# Patient Record
Sex: Female | Born: 1960 | Race: White | Hispanic: No | Marital: Married | Smoking: Former smoker
Health system: Southern US, Community
[De-identification: ages and names within clinical notes are randomized; demographics above are authoritative.]

## PROBLEM LIST (undated history)

## (undated) DIAGNOSIS — J449 Chronic obstructive pulmonary disease, unspecified: Secondary | ICD-10-CM

## (undated) DIAGNOSIS — I10 Essential (primary) hypertension: Secondary | ICD-10-CM

## (undated) DIAGNOSIS — K219 Gastro-esophageal reflux disease without esophagitis: Secondary | ICD-10-CM

## (undated) DIAGNOSIS — F419 Anxiety disorder, unspecified: Secondary | ICD-10-CM

## (undated) HISTORY — DX: Gastro-esophageal reflux disease without esophagitis: K21.9

## (undated) HISTORY — PX: ABLATION: SHX5711

## (undated) HISTORY — DX: Essential (primary) hypertension: I10

## (undated) HISTORY — DX: Anxiety disorder, unspecified: F41.9

## (undated) HISTORY — DX: Chronic obstructive pulmonary disease, unspecified: J44.9

---

## 1985-12-26 HISTORY — PX: TUBAL LIGATION: SHX77

## 1989-12-26 HISTORY — PX: RECONSTRUCTION OF NOSE: SHX2301

## 2001-11-27 ENCOUNTER — Other Ambulatory Visit: Admission: RE | Admit: 2001-11-27 | Discharge: 2001-11-27 | Payer: Self-pay | Admitting: Obstetrics and Gynecology

## 2004-04-05 ENCOUNTER — Encounter: Admission: RE | Admit: 2004-04-05 | Discharge: 2004-04-05 | Payer: Self-pay | Admitting: Family Medicine

## 2004-10-01 ENCOUNTER — Ambulatory Visit (HOSPITAL_COMMUNITY): Admission: RE | Admit: 2004-10-01 | Discharge: 2004-10-01 | Payer: Self-pay | Admitting: *Deleted

## 2011-01-16 ENCOUNTER — Encounter: Payer: Self-pay | Admitting: Family Medicine

## 2011-01-16 ENCOUNTER — Encounter: Payer: Self-pay | Admitting: *Deleted

## 2012-07-19 ENCOUNTER — Ambulatory Visit (INDEPENDENT_AMBULATORY_CARE_PROVIDER_SITE_OTHER): Payer: BC Managed Care – PPO | Admitting: Family Medicine

## 2012-07-19 VITALS — BP 120/88 | HR 63 | Temp 98.5°F | Resp 16 | Ht 66.0 in | Wt 166.0 lb

## 2012-07-19 DIAGNOSIS — Z23 Encounter for immunization: Secondary | ICD-10-CM

## 2012-07-19 DIAGNOSIS — S61409A Unspecified open wound of unspecified hand, initial encounter: Secondary | ICD-10-CM

## 2012-07-19 DIAGNOSIS — M25549 Pain in joints of unspecified hand: Secondary | ICD-10-CM

## 2012-07-19 MED ORDER — TETANUS-DIPHTH-ACELL PERTUSSIS 5-2.5-18.5 LF-MCG/0.5 IM SUSP
0.5000 mL | Freq: Once | INTRAMUSCULAR | Status: AC
  Administered 2012-07-19: 0.5 mL via INTRAMUSCULAR

## 2012-07-19 NOTE — Progress Notes (Signed)
Patient ID: SHERRELL WEIR MRN: [id number], DOB: 1961-09-01, 51 y.o. Date of Encounter: 07/19/2012, 6:25 PM   PROCEDURE NOTE: Wound of left distal index finger.   Verbal consent obtained. Sterile technique employed. Numbing: Anesthesia obtained with metacarpal block 2% lidocaine plain.  Cleansed with soap and water. Irrigated.  Wound explored, no deep structures involved, no foreign bodies.   Wound repaired with # 5 SI using 4-0 ethilon. Hemostasis obtained. Wound cleansed and dressed.  Wound care instructions including precautions covered with patient. Handout given.  Anticipate suture removal in 10 days.   Rhoderick Moody, PA-C 07/19/2012 6:25 PM

## 2012-07-19 NOTE — Patient Instructions (Addendum)

## 2012-07-19 NOTE — Progress Notes (Signed)
:   Subjective: Patient herself with scissors tried probably top off of something tetanus shot was many years ago.  Objective: Wound over the crease of the DFE of the left index finger, approximately 1 CM. Full range of motion.  Assessment: Wound hand  Plan: TDAP Herbert Seta will do the wound repair

## 2012-07-28 ENCOUNTER — Ambulatory Visit (INDEPENDENT_AMBULATORY_CARE_PROVIDER_SITE_OTHER): Payer: BC Managed Care – PPO | Admitting: Physician Assistant

## 2012-07-28 VITALS — BP 114/74 | HR 66 | Temp 98.1°F | Resp 18 | Ht 67.0 in | Wt 168.0 lb

## 2012-07-28 DIAGNOSIS — S61209A Unspecified open wound of unspecified finger without damage to nail, initial encounter: Secondary | ICD-10-CM

## 2012-07-28 NOTE — Progress Notes (Signed)
  Subjective:    Patient ID: Carrie Dillon, female    DOB: August 18, 1961, 51 y.o.   MRN: [id number]  HPI Pt presents to clinic for suture removal. The wound has healed it is still slightly tender, esp with use.   Review of Systems     Objective:   Physical Exam  Constitutional: She is oriented to person, place, and time. She appears well-developed and well-nourished.  HENT:  Head: Normocephalic and atraumatic.  Right Ear: External ear normal.  Left Ear: External ear normal.  Pulmonary/Chest: Effort normal.  Neurological: She is alert and oriented to person, place, and time.  Skin: Skin is warm and dry.       Wound well healed without any erythema or other signs of infection.  Sutures removed without difficulty.  Psychiatric: She has a normal mood and affect. Her behavior is normal. Judgment and thought content normal.       Assessment & Plan:  Wound finger - healed - wound care d/w pt.

## 2012-12-13 ENCOUNTER — Ambulatory Visit (INDEPENDENT_AMBULATORY_CARE_PROVIDER_SITE_OTHER): Payer: BC Managed Care – PPO | Admitting: Family Medicine

## 2012-12-13 VITALS — BP 180/94 | HR 79 | Temp 97.9°F | Resp 16 | Ht 66.5 in | Wt 174.0 lb

## 2012-12-13 DIAGNOSIS — I10 Essential (primary) hypertension: Secondary | ICD-10-CM | POA: Insufficient documentation

## 2012-12-13 DIAGNOSIS — K219 Gastro-esophageal reflux disease without esophagitis: Secondary | ICD-10-CM | POA: Insufficient documentation

## 2012-12-13 DIAGNOSIS — F411 Generalized anxiety disorder: Secondary | ICD-10-CM

## 2012-12-13 DIAGNOSIS — R079 Chest pain, unspecified: Secondary | ICD-10-CM

## 2012-12-13 DIAGNOSIS — F419 Anxiety disorder, unspecified: Secondary | ICD-10-CM | POA: Insufficient documentation

## 2012-12-13 LAB — POCT CBC
Granulocyte percent: 51.6 %G (ref 37–80)
Hemoglobin: 13.3 g/dL (ref 12.2–16.2)
Lymph, poc: 4.2 — AB (ref 0.6–3.4)
MCH, POC: 29.6 pg (ref 27–31.2)
MCHC: 31.1 g/dL — AB (ref 31.8–35.4)
MPV: 9.4 fL (ref 0–99.8)
POC MID %: 7.5 %M (ref 0–12)
RDW, POC: 12.6 %
WBC: 10.3 10*3/uL — AB (ref 4.6–10.2)

## 2012-12-13 MED ORDER — DIAZEPAM 5 MG PO TABS: 5.0000 mg | ORAL_TABLET | Freq: Every evening | ORAL | Status: DC | PRN

## 2012-12-13 MED ORDER — LISINOPRIL-HYDROCHLOROTHIAZIDE 10-12.5 MG PO TABS: 1.0000 | ORAL_TABLET | Freq: Every day | ORAL | Status: DC

## 2012-12-13 MED ORDER — ALPRAZOLAM 0.5 MG PO TABS: 0.2500 mg | ORAL_TABLET | Freq: Every evening | ORAL | Status: DC | PRN

## 2012-12-13 NOTE — Progress Notes (Signed)
Subjective:    Patient ID: Carrie Dillon, female    DOB: December 05, 1961, 51 y.o.   MRN: [id number]  HPI  This 51 y.o. female presents for evaluation of HTN. It is unclear if she has a history of HTN, but she routinely checked her BP until about a year ago. Started checking her BP at home again about 3 weeks ago.  Notes that it's been elevated, and has been rising.  138-180's/80's-90's.  The last few days, she's checking 4-5 times daily. Today, she got scared when she read on the internet that if her pressure was above 180, she needed to seek immediate medical attention.  She describes the sensation of difficulty swallowing and tightness in her throat, but thinks that's related to anxiety.  Her husband is currently out of work, and she's financially very worried-they can't afford their mortgage on her salary alone.  History of chest pain, "All the time."  Has been evaluated here, and referred to cardiology, who told her that her pain was GERD rather than cardiac. She has long-standing anxiety, but reports that she's felt worse with the medications she's been given previously.  She doesn't want anything she has to take every day. Has some HA.  No SOB, dizziness.  Past Medical History  Diagnosis Date  . Anxiety   . Hypertension   . GERD (gastroesophageal reflux disease)     Past Surgical History  Procedure Date  . Tubal ligation   . Cesarean section     Prior to Admission medications   Medication Sig Start Date End Date Taking? Authorizing Provider  omeprazole (PRILOSEC) 20 MG capsule Take 20 mg by mouth daily.   Yes Historical Provider, MD    Allergies  Allergen Reactions  . Codeine Nausea Only    History   Social History  . Marital Status: Married    Spouse Name: Gustavus "Emilee Hero"     Number of Children: 2  . Years of Education: 12   Occupational History  . sales    Social History Main Topics  . Smoking status: Former Games developer  . Smokeless tobacco: Never Used  . Alcohol  Use: 0.0 - 1.5 oz/week    0-3 drink(s) per week     Comment: once every 6 months  . Drug Use: Yes    Special: Marijuana     Comment: Tried x 3 to relax, but caused increased stress  . Sexually Active: Yes -- Female partner(s)    Birth Control/ Protection: Surgical   Other Topics Concern  . Not on file   Social History Narrative   Lives with her husband. Her adult children live in [place], [place].    Family History  Problem Relation Age of Onset  . Anxiety disorder Mother   . Colitis Mother   . Ovarian cancer Mother   . Cancer Mother   . Skin cancer Father   . Breast cancer Sister   . Cancer Sister   . Anxiety disorder Brother   . Anxiety disorder Daughter     Review of Systems As above.    Objective:   Physical Exam Blood pressure 180/94, pulse 79, temperature 97.9 F (36.6 C), resp. rate 16, height 5' 6.5" (1.689 m), weight 174 lb (78.926 kg), SpO2 99.00%. Body mass index is 27.66 kg/(m^2). Well-developed, well nourished WF who is awake, alert and oriented, in NAD, though she was initially crying. HEENT: [place]/AT, PERRL, EOMI.  Sclera and conjunctiva are clear.  EAC are patent, TMs are normal in appearance.  Nasal mucosa is pink and moist. OP is clear. Neck: supple, non-tender, no lymphadenopathy, thyromegaly. Heart: RRR, no murmur Lungs: normal effort, CTA Extremities: no cyanosis, clubbing or edema. Skin: warm and dry without rash. Psychologic: good mood and appropriate affect, normal speech and behavior.  EKG: NSR.  No evidence of ischemia/LVH.   Results for orders placed in visit on 12/13/12  POCT CBC      Component Value Range   WBC 10.3 (*) 4.6 - 10.2 K/uL   Lymph, poc 4.2 (*) 0.6 - 3.4   POC LYMPH PERCENT 40.9  10 - 50 %L   MID (cbc) 0.8  0 - 0.9   POC MID % 7.5  0 - 12 %M   POC Granulocyte 5.3  2 - 6.9   Granulocyte percent 51.6  37 - 80 %G   RBC 4.49  4.04 - 5.48 M/uL   Hemoglobin 13.3  12.2 - 16.2 g/dL   HCT, POC 16.1  09.6 - 47.9 %   MCV 95.2  80 -  97 fL   MCH, POC 29.6  27 - 31.2 pg   MCHC 31.1 (*) 31.8 - 35.4 g/dL   RDW, POC 04.5     Platelet Count, POC 338  142 - 424 K/uL   MPV 9.4  0 - 99.8 fL       Assessment & Plan:   1. HTN (hypertension)  POCT CBC, TSH, Comprehensive metabolic panel, lisinopril-hydrochlorothiazide (PRINZIDE,ZESTORETIC) 10-12.5 MG per tablet; I suspect that her anxiety is driving her pressure up.  2. GERD (gastroesophageal reflux disease)  Continue omeprazole; likely needs additional evaluation once her BP is controlled, given that she has persistent symptoms (chest pain)  3. Anxiety  diazepam (VALIUM) 5 MG tablet  4. Chest pain  EKG 12-Lead; likely reflux   Discussed with Dr. Clelia Croft.

## 2012-12-13 NOTE — Patient Instructions (Signed)
Stop checking your blood pressure at home.  Do at least one thing every day that makes you happy.

## 2012-12-14 ENCOUNTER — Encounter: Payer: Self-pay | Admitting: Physician Assistant

## 2012-12-14 LAB — COMPREHENSIVE METABOLIC PANEL
AST: 16 U/L (ref 0–37)
BUN: 11 mg/dL (ref 6–23)
CO2: 26 mEq/L (ref 19–32)
Calcium: 9.5 mg/dL (ref 8.4–10.5)
Chloride: 107 mEq/L (ref 96–112)
Creat: 0.82 mg/dL (ref 0.50–1.10)

## 2012-12-19 NOTE — Progress Notes (Signed)
Reviewed. Agree with assessment and plan 

## 2012-12-23 ENCOUNTER — Encounter: Payer: Self-pay | Admitting: Physician Assistant

## 2013-01-16 ENCOUNTER — Ambulatory Visit (INDEPENDENT_AMBULATORY_CARE_PROVIDER_SITE_OTHER): Payer: BC Managed Care – PPO | Admitting: Physician Assistant

## 2013-01-16 ENCOUNTER — Encounter: Payer: Self-pay | Admitting: Physician Assistant

## 2013-01-16 VITALS — BP 129/86 | HR 79 | Temp 98.2°F | Resp 16 | Ht 67.0 in | Wt 173.0 lb

## 2013-01-16 DIAGNOSIS — R238 Other skin changes: Secondary | ICD-10-CM

## 2013-01-16 DIAGNOSIS — R209 Unspecified disturbances of skin sensation: Secondary | ICD-10-CM

## 2013-01-16 DIAGNOSIS — R519 Headache, unspecified: Secondary | ICD-10-CM

## 2013-01-16 DIAGNOSIS — R51 Headache: Secondary | ICD-10-CM

## 2013-01-16 DIAGNOSIS — I10 Essential (primary) hypertension: Secondary | ICD-10-CM

## 2013-01-16 MED ORDER — LISINOPRIL-HYDROCHLOROTHIAZIDE 10-12.5 MG PO TABS: 1.0000 | ORAL_TABLET | Freq: Every day | ORAL | Status: DC

## 2013-01-16 NOTE — Progress Notes (Signed)
   [address], [place] [address]   Phone [phone]  Subjective:    Patient ID: Carrie Dillon, female    DOB: Jun 12, 1961, 52 y.o.   MRN: [id number]  HPI  Pt presents to clinic for BP recheck.  She has been feeling much better since starting on this medication.  She has had home readings around 115/75 - yesterday she felt bad with some chest pressure and it was the highest it has been since the start of medications and it was 146/89.  She is tolerating the medication good.  She is only having a concern about her R fingers getting really cold recently since she changed jobs and is now using a mouse a lot at work.  She has noticed no change in color of her fingers and it only happens while on the mouse for long periods of time.  She just warms it up and it feels better.  She also randomly has pain in her R temple when she coughs. Pain is sharp and only with hard cough and does not last very long.     Review of Systems  Constitutional: Negative.   Respiratory: Negative for chest tightness.   Cardiovascular: Positive for chest pain (associated with anxiety and when BP is up but resolves quickly). Negative for palpitations and leg swelling.       Objective:   Physical Exam  Vitals reviewed. Constitutional: She is oriented to person, place, and time. She appears well-developed and well-nourished.  HENT:  Head: Normocephalic and atraumatic.  Right Ear: External ear normal.  Left Ear: External ear normal.  Eyes: Conjunctivae normal are normal.  Cardiovascular: Normal rate, regular rhythm and normal heart sounds.   No murmur heard. Pulmonary/Chest: Effort normal and breath sounds normal. She has no wheezes.  Neurological: She is alert and oriented to person, place, and time.  Skin: Skin is warm and dry.       No discoloration of fingertips c/w L side.  Good capillary refill.  No temperature difference between L and R fingers.  Good pulses.  Psychiatric: She has a normal mood and affect.  Her behavior is normal. Judgment and thought content normal.       Assessment & Plan:   1. HTN (hypertension)  lisinopril-hydrochlorothiazide (PRINZIDE,ZESTORETIC) 10-12.5 MG per tablet  2. Head pain    3. Cold finger without peripheral vascular disease     1- improved BP control - pt to continue medications - due to significant improvement with low dose medication I want pt to continue monitoring and if her BP continues to decrease or if patient starts to have symptoms of hypotension - we will consider splitting her medication to separate pills.  She is to monitor her salt intake and try to lose weight and she may no longer needs medication. 2- I think that the pain she is having with cough is due to nerve irritation and coughing - pt to monitor and it if gets worse or she gets a resulting HA she is to let me know. 3- I think related to position of wrist and hands due to only symptoms with certain positions but if things change we will consider an arterial doppler to look at blood flow.  Pt understands and agrees with the above.

## 2013-01-31 ENCOUNTER — Other Ambulatory Visit: Payer: Self-pay | Admitting: Dermatology

## 2013-02-09 ENCOUNTER — Other Ambulatory Visit: Payer: Self-pay

## 2013-04-24 ENCOUNTER — Ambulatory Visit: Payer: BC Managed Care – PPO | Admitting: Physician Assistant

## 2013-05-08 ENCOUNTER — Ambulatory Visit (INDEPENDENT_AMBULATORY_CARE_PROVIDER_SITE_OTHER): Payer: BC Managed Care – PPO | Admitting: Physician Assistant

## 2013-05-08 ENCOUNTER — Encounter: Payer: Self-pay | Admitting: Physician Assistant

## 2013-05-08 VITALS — BP 135/87 | HR 77 | Temp 97.9°F | Resp 16 | Ht 67.0 in | Wt 179.0 lb

## 2013-05-08 DIAGNOSIS — I998 Other disorder of circulatory system: Secondary | ICD-10-CM

## 2013-05-08 DIAGNOSIS — R58 Hemorrhage, not elsewhere classified: Secondary | ICD-10-CM

## 2013-05-08 DIAGNOSIS — I10 Essential (primary) hypertension: Secondary | ICD-10-CM

## 2013-05-08 DIAGNOSIS — M7989 Other specified soft tissue disorders: Secondary | ICD-10-CM

## 2013-05-08 LAB — COMPREHENSIVE METABOLIC PANEL
Albumin: 4.4 g/dL (ref 3.5–5.2)
Alkaline Phosphatase: 66 U/L (ref 39–117)
CO2: 30 mEq/L (ref 19–32)
Chloride: 103 mEq/L (ref 96–112)
Glucose, Bld: 89 mg/dL (ref 70–99)
Potassium: 4.3 mEq/L (ref 3.5–5.3)
Sodium: 140 mEq/L (ref 135–145)
Total Protein: 6.6 g/dL (ref 6.0–8.3)

## 2013-05-08 LAB — LIPID PANEL
Cholesterol: 204 mg/dL — ABNORMAL HIGH (ref 0–200)
Total CHOL/HDL Ratio: 3.2 Ratio

## 2013-05-08 LAB — CBC
MCH: 30.8 pg (ref 26.0–34.0)
Platelets: 395 10*3/uL (ref 150–400)
RBC: 4.52 MIL/uL (ref 3.87–5.11)

## 2013-05-08 MED ORDER — CHLORTHALIDONE 25 MG PO TABS: 25.0000 mg | ORAL_TABLET | Freq: Every day | ORAL | Status: DC

## 2013-05-08 MED ORDER — LISINOPRIL 10 MG PO TABS: 10.0000 mg | ORAL_TABLET | Freq: Every day | ORAL | Status: DC

## 2013-05-09 NOTE — Progress Notes (Signed)
   [address], [place] [address]   Phone [phone]  Subjective:    Patient ID: Carrie Dillon, female    DOB: 01-07-1961, 52 y.o.   MRN: [id number]  HPI Pt presents to clinic for HTN recheck and several other concerns. She is doing well with her BP, she is tolerating the meds good.  She is worried about the swelling in her legs - the swelling is resolved in the am but in the evening her feet are swollen and they are tight and painful.  She has been drinking more water and eating more frozen entrees for lunch.  She also feels like she is bruising more than normal even with only a slight bump.  She also is having some lightheaded only when she is out in her garden so stands up quickly.  She has not used her Valium is a really long time, she likes just having it just in case.   Review of Systems  Constitutional: Negative for fever and chills.  HENT:       No nosebleeds or gum bleeding  Eyes: Negative for photophobia.  Respiratory: Negative for shortness of breath.   Cardiovascular: Positive for leg swelling. Negative for chest pain.  Hematological: Bruises/bleeds easily.       Objective:   Physical Exam  Vitals reviewed. Constitutional: She is oriented to person, place, and time. She appears well-developed and well-nourished.  HENT:  Head: Normocephalic and atraumatic.  Right Ear: External ear normal.  Left Ear: External ear normal.  Cardiovascular: Normal rate, regular rhythm and normal heart sounds.   No murmur heard. Pulmonary/Chest: Effort normal and breath sounds normal. No respiratory distress.  Musculoskeletal:       Right lower leg: She exhibits swelling (2+ edema uup 2/3 of shin - slightly worse than the L side).       Left lower leg: She exhibits swelling (2+ pitting edema  up to 2/3 of shin).  Neurological: She is alert and oriented to person, place, and time.  Skin: Skin is warm and dry.  Some bruising on arms and legs - minimal  Psychiatric: She has a normal  mood and affect. Her behavior is normal. Thought content normal.          Assessment & Plan:  HTN (hypertension) - will change her diuretic to see if she will have improved BP control and at the same time improve her LE edema Plan: Comprehensive metabolic panel, Lipid panel, lisinopril (PRINIVIL,ZESTRIL) 10 MG tablet, chlorthalidone (HYGROTON) 25 MG tablet  Leg swelling - pt has recent gained quite a bit of weight and this might be from that, it is probably from her increased salt intake from the frozen meals she has been eating for lunch.  She will elevate her legs as much as possible - the swelling might get worse over the summer - Plan: Comprehensive metabolic panel  Ecchymosis - Plan: CBC - we will check her platelets but I think that these are normal.  Pt will recheck in 3 months  Benny Lennert PA-C 05/09/2013 1:12 PM

## 2013-06-25 ENCOUNTER — Encounter: Payer: Self-pay | Admitting: Physician Assistant

## 2013-06-26 ENCOUNTER — Telehealth: Payer: Self-pay

## 2013-06-26 NOTE — Telephone Encounter (Signed)
I agree

## 2013-06-26 NOTE — Progress Notes (Signed)
.  .  .        xx

## 2013-06-26 NOTE — Telephone Encounter (Signed)
Pt is experiencing side effects to medication.   [phone]

## 2013-06-26 NOTE — Telephone Encounter (Signed)
Patient is experiencing side effects to her new meds.     [phone]

## 2013-06-26 NOTE — Telephone Encounter (Signed)
Spoke to patient, her heart rate is 110 and she feels terrible, I have advised her to come in today for this, just in case it is not the medication. To you FYI

## 2013-07-03 ENCOUNTER — Ambulatory Visit: Payer: BC Managed Care – PPO | Admitting: Physician Assistant

## 2013-08-18 ENCOUNTER — Telehealth: Payer: Self-pay

## 2013-08-18 DIAGNOSIS — I10 Essential (primary) hypertension: Secondary | ICD-10-CM

## 2013-08-18 MED ORDER — CHLORTHALIDONE 25 MG PO TABS: 25.0000 mg | ORAL_TABLET | Freq: Every day | ORAL | Status: DC

## 2013-08-18 NOTE — Telephone Encounter (Signed)
I have sent the refill into the pharmacy - it is time for her OV.

## 2013-08-18 NOTE — Telephone Encounter (Signed)
Patient states she no longer has her Bp med (Chlorthalidone ) and that she would like a refill asap. She would like it sent to [facility] located on [address]. Thanks

## 2013-08-28 ENCOUNTER — Encounter: Payer: Self-pay | Admitting: Physician Assistant

## 2013-08-28 ENCOUNTER — Ambulatory Visit (INDEPENDENT_AMBULATORY_CARE_PROVIDER_SITE_OTHER): Payer: BC Managed Care – PPO | Admitting: Physician Assistant

## 2013-08-28 VITALS — BP 120/75 | HR 83 | Temp 98.4°F | Resp 16 | Ht 67.0 in | Wt 184.4 lb

## 2013-08-28 DIAGNOSIS — Z6379 Other stressful life events affecting family and household: Secondary | ICD-10-CM

## 2013-08-28 DIAGNOSIS — Z638 Other specified problems related to primary support group: Secondary | ICD-10-CM

## 2013-08-28 DIAGNOSIS — M7989 Other specified soft tissue disorders: Secondary | ICD-10-CM

## 2013-08-28 DIAGNOSIS — K219 Gastro-esophageal reflux disease without esophagitis: Secondary | ICD-10-CM

## 2013-08-28 DIAGNOSIS — I10 Essential (primary) hypertension: Secondary | ICD-10-CM

## 2013-08-28 LAB — CBC
Hemoglobin: 12.4 g/dL (ref 12.0–15.0)
RBC: 4.07 MIL/uL (ref 3.87–5.11)
WBC: 9.9 10*3/uL (ref 4.0–10.5)

## 2013-08-28 LAB — COMPREHENSIVE METABOLIC PANEL
Albumin: 3.8 g/dL (ref 3.5–5.2)
Alkaline Phosphatase: 63 U/L (ref 39–117)
BUN: 11 mg/dL (ref 6–23)
Calcium: 9.3 mg/dL (ref 8.4–10.5)
Chloride: 108 mEq/L (ref 96–112)
Glucose, Bld: 87 mg/dL (ref 70–99)
Potassium: 4.1 mEq/L (ref 3.5–5.3)

## 2013-08-28 MED ORDER — HYDROCHLOROTHIAZIDE 25 MG PO TABS: 12.5000 mg | ORAL_TABLET | Freq: Every day | ORAL | Status: DC

## 2013-08-28 MED ORDER — LISINOPRIL 10 MG PO TABS: 10.0000 mg | ORAL_TABLET | Freq: Every day | ORAL | Status: DC

## 2013-08-28 MED ORDER — OMEPRAZOLE 20 MG PO CPDR: 20.0000 mg | DELAYED_RELEASE_CAPSULE | Freq: Every day | ORAL | Status: DC

## 2013-08-28 NOTE — Progress Notes (Signed)
[address] [place] [address]   Phone [phone]  Subjective:    Patient ID: Carrie Dillon, female    DOB: [birth year], 52 y.o.   MRN: [id number]  HPI  Pt presents to clinic for a HTN f/u and leg swelling.  She felt like the Chlorthalidone helped with the swelling but she noticed that she was lightheaded and her pulse was typically about 130 and she felt terrible.  She stopped it about 2 weeks ago and has felt much better but has noticed that her feet are more swollen.  She sits all day at work.  She notices that the swelling is reduced with leg elevation and she has noticed with the reduction of salt the swelling has reduced significantly.  She has recently found out that her sister has Stage 3 multiple myeloma and she is really sad and upset with this news but she feels like her emotions are normal and she is more mad at her family because they do not seem to be accepting the severity of her diagnosis so my patients feels alone and cannot talk to anyone.  She has her Valium for when she gets really upset but she rarely uses them - she really likes to know that that are available and this makes her feel better.  She has not been sleeping great since the news.  She has been having small panic attacks where she gets pain in her L side (same pain when she went through a nasty divorce in 1991 and left her family).   Review of Systems  Constitutional: Negative.   HENT: Negative.   Respiratory: Negative for cough and shortness of breath.   Cardiovascular: Negative for chest pain.  Gastrointestinal: Positive for abdominal pain (intermittent - only when she gets really upset).  Psychiatric/Behavioral: Positive for sleep disturbance. Dysphoric mood: with recent diagnosis of sisters cancer. The patient is not nervous/anxious.        Objective:   Physical Exam  Vitals reviewed. Constitutional: She is oriented to person, place, and time. She appears well-developed and well-nourished.  HENT:    Head: Normocephalic and atraumatic.  Right Ear: External ear normal.  Left Ear: External ear normal.  Eyes: Conjunctivae are normal.  Neck: Normal range of motion.  Cardiovascular: Normal rate, regular rhythm and normal heart sounds.   No murmur heard. Pulmonary/Chest: Effort normal.  Abdominal: Soft. There is no CVA tenderness.  Musculoskeletal: Normal range of motion.       Lumbar back: She exhibits normal range of motion, no tenderness and no bony tenderness.  No obvious lower extremity edema -   Neurological: She is alert and oriented to person, place, and time.  Skin: Skin is warm and dry.  Psychiatric: She has a normal mood and affect. Her behavior is normal. Judgment and thought content normal.          Assessment & Plan:  HTN (hypertension) - Plan: Comprehensive metabolic panel, CBC, lisinopril (PRINIVIL,ZESTRIL) 10 MG tablet, hydrochlorothiazide (HYDRODIURIL) 25 MG tablet - continue current meds - f/u in 6 months.  Bilateral swelling of feet- Chlorthalidone was obviously causing symptomatic hypotension.  We will try HCTZ to see if it has less effect on her HTN and controls her swelling.  Pt will continue to eat a low salt diet and elevate/move feet/legs.  She will call if the meds work. - Plan: hydrochlorothiazide (HYDRODIURIL) 25 MG tablet  GERD (gastroesophageal reflux disease) -Continue current plan. Plan: omeprazole (PRILOSEC) 20 MG capsule  Stressful life  event affecting family - d/w with patient treatment options but patient feels like she is dealing well with there recent life stressor and I would tend to agree at this time - she seems to have good coping mechanisms in place.  Benny Lennert PA-C 08/28/2013 4:23 PM

## 2013-09-26 ENCOUNTER — Telehealth: Payer: Self-pay

## 2013-09-26 NOTE — Telephone Encounter (Signed)
Patient states that her prescription for hydrodiuril needs prior approval. Patient uses [facility] on [address].  [phone] or [phone]

## 2013-09-27 ENCOUNTER — Telehealth: Payer: Self-pay

## 2013-09-27 DIAGNOSIS — I10 Essential (primary) hypertension: Secondary | ICD-10-CM

## 2013-09-27 DIAGNOSIS — M7989 Other specified soft tissue disorders: Secondary | ICD-10-CM

## 2013-09-27 MED ORDER — HYDROCHLOROTHIAZIDE 25 MG PO TABS: 12.5000 mg | ORAL_TABLET | Freq: Every day | ORAL | Status: DC

## 2013-09-27 NOTE — Telephone Encounter (Signed)
Pt of Benny Lennert. Needs rx refills. Was told it was called in yesterday, is wondering what is going on. Uses [facility] on [address]. Call at [phone].

## 2013-09-27 NOTE — Telephone Encounter (Signed)
Rx sent to pharmacy   

## 2013-10-10 NOTE — Telephone Encounter (Signed)
I never received any notice from pharm and this med does not normally need a PA. Called to check w/pharm and was advised there is no problem, pt p/up Rx on 09/27/13.

## 2013-10-31 ENCOUNTER — Other Ambulatory Visit: Payer: Self-pay

## 2013-12-26 DIAGNOSIS — J449 Chronic obstructive pulmonary disease, unspecified: Secondary | ICD-10-CM

## 2013-12-26 HISTORY — DX: Chronic obstructive pulmonary disease, unspecified: J44.9

## 2014-01-01 ENCOUNTER — Encounter: Payer: BC Managed Care – PPO | Admitting: Gynecology

## 2014-02-10 ENCOUNTER — Encounter: Payer: BC Managed Care – PPO | Admitting: Gynecology

## 2014-02-26 ENCOUNTER — Ambulatory Visit (INDEPENDENT_AMBULATORY_CARE_PROVIDER_SITE_OTHER): Payer: BC Managed Care – PPO | Admitting: Internal Medicine

## 2014-02-26 VITALS — BP 120/72 | HR 77 | Temp 97.9°F | Resp 16 | Ht 66.5 in | Wt 183.0 lb

## 2014-02-26 DIAGNOSIS — F411 Generalized anxiety disorder: Secondary | ICD-10-CM

## 2014-02-26 DIAGNOSIS — Z87891 Personal history of nicotine dependence: Secondary | ICD-10-CM

## 2014-02-26 DIAGNOSIS — R0602 Shortness of breath: Secondary | ICD-10-CM

## 2014-02-26 DIAGNOSIS — F419 Anxiety disorder, unspecified: Secondary | ICD-10-CM

## 2014-02-26 DIAGNOSIS — M7989 Other specified soft tissue disorders: Secondary | ICD-10-CM

## 2014-02-26 DIAGNOSIS — I10 Essential (primary) hypertension: Secondary | ICD-10-CM

## 2014-02-26 LAB — COMPLETE METABOLIC PANEL WITH GFR
ALT: 15 U/L (ref 0–35)
AST: 17 U/L (ref 0–37)
Albumin: 4.6 g/dL (ref 3.5–5.2)
Alkaline Phosphatase: 72 U/L (ref 39–117)
BILIRUBIN TOTAL: 0.5 mg/dL (ref 0.2–1.2)
BUN: 14 mg/dL (ref 6–23)
CO2: 27 meq/L (ref 19–32)
CREATININE: 0.78 mg/dL (ref 0.50–1.10)
Calcium: 9.9 mg/dL (ref 8.4–10.5)
Chloride: 99 mEq/L (ref 96–112)
GFR, EST NON AFRICAN AMERICAN: 88 mL/min
Glucose, Bld: 82 mg/dL (ref 70–99)
Potassium: 3.9 mEq/L (ref 3.5–5.3)
Sodium: 137 mEq/L (ref 135–145)
Total Protein: 6.8 g/dL (ref 6.0–8.3)

## 2014-02-26 MED ORDER — LISINOPRIL 10 MG PO TABS: 10.0000 mg | ORAL_TABLET | Freq: Every day | ORAL | Status: DC

## 2014-02-26 MED ORDER — ALBUTEROL SULFATE HFA 108 (90 BASE) MCG/ACT IN AERS: 2.0000 | INHALATION_SPRAY | Freq: Four times a day (QID) | RESPIRATORY_TRACT | Status: DC | PRN

## 2014-02-26 MED ORDER — HYDROCHLOROTHIAZIDE 25 MG PO TABS: 12.5000 mg | ORAL_TABLET | Freq: Every day | ORAL | Status: DC

## 2014-02-26 MED ORDER — MOMETASONE FURO-FORMOTEROL FUM 100-5 MCG/ACT IN AERO: 2.0000 | INHALATION_SPRAY | Freq: Two times a day (BID) | RESPIRATORY_TRACT | Status: DC

## 2014-02-26 MED ORDER — DIAZEPAM 5 MG PO TABS: 5.0000 mg | ORAL_TABLET | Freq: Every evening | ORAL | Status: DC | PRN

## 2014-02-26 NOTE — Progress Notes (Signed)
   Subjective:    Patient ID: Carrie Dillon, female    DOB: 11/12/1961, 53 y.o.   MRN: [id number]  HPI Pt presents to clinic for a recheck of her HTN - she is doing well and her swelling in her feet has resolved and she is tolerating the HCTZ well in regards to her BP.  She would like a refill of her Valium - she still has 5 left but feels better when she has them.  She is less stressed about her sister because she as of now is doing well.  She is concerned because her L ear is itching.  She has also noticed that she is SOB wen exercising.  She has been trying to exercise and has not been able to do it as much as she would like she is SOB with rigorous activity - she went hiking in the mountains and had to stop because she was so SOB.  She does have episodes of wheezing at times.  She has no chest pain when she has these SOB episodes.  She has not always had this just with exercise  Review of Systems  Respiratory: Positive for cough (intermittent sputum - ?color), shortness of breath and wheezing.   Cardiovascular: Negative for chest pain.       Objective:   Physical Exam  Vitals reviewed. Constitutional: She is oriented to person, place, and time. She appears well-developed and well-nourished.  HENT:  Head: Normocephalic and atraumatic.  Right Ear: External ear normal.  Left Ear: External ear normal.  Eyes: Conjunctivae are normal.  Pulmonary/Chest: Effort normal. She has no wheezes.  Prolonged expiration  Neurological: She is alert and oriented to person, place, and time.  Skin: Skin is warm and dry.  Psychiatric: She has a normal mood and affect. Her behavior is normal. Judgment and thought content normal.   Spirometry review - severe obstructive pattern     Assessment & Plan:  HTN (hypertension) - Plan: lisinopril (PRINIVIL,ZESTRIL) 10 MG tablet, hydrochlorothiazide (HYDRODIURIL) 25 MG tablet, COMPLETE METABOLIC PANEL WITH GFR  Bilateral swelling of feet - Plan:  hydrochlorothiazide (HYDRODIURIL) 25 MG tablet  Anxiety - med refilled - Plan: diazepam (VALIUM) 5 MG tablet  Former smoker - Plan: Pulmonary function test  SOB (shortness of breath) - Concerning for COPD - we will start an inhaled steroid and rescue inhaler for exercise - we will recheck the patient in the next 4-6 weeks for improvement of symptoms and spirometry as well as a chest xray at that time to allow the patient to take a fuller deep breath without coughing - Plan: Pulmonary function test, mometasone-formoterol (DULERA) 100-5 MCG/ACT AERO, albuterol (PROVENTIL HFA;VENTOLIN HFA) 108 (90 BASE) MCG/ACT inhaler  Carrie Hummingbird PA-C 02/26/2014 7:40 PM   I have reviewed and agree with documentation. Carrie Dillon, M.D.

## 2014-02-28 LAB — PULMONARY FUNCTION TEST

## 2014-03-03 ENCOUNTER — Telehealth: Payer: Self-pay | Admitting: Family Medicine

## 2014-03-03 NOTE — Telephone Encounter (Signed)
Called and left vm for patient give [place] a call back to schedule an 4-6 wk follow up

## 2014-03-05 ENCOUNTER — Telehealth: Payer: Self-pay

## 2014-03-05 ENCOUNTER — Encounter: Payer: Self-pay | Admitting: Gynecology

## 2014-03-05 ENCOUNTER — Ambulatory Visit (INDEPENDENT_AMBULATORY_CARE_PROVIDER_SITE_OTHER): Payer: BC Managed Care – PPO | Admitting: Gynecology

## 2014-03-05 VITALS — BP 117/82 | HR 73 | Resp 16 | Ht 66.0 in | Wt 186.0 lb

## 2014-03-05 DIAGNOSIS — Z803 Family history of malignant neoplasm of breast: Secondary | ICD-10-CM

## 2014-03-05 DIAGNOSIS — Z8041 Family history of malignant neoplasm of ovary: Secondary | ICD-10-CM

## 2014-03-05 DIAGNOSIS — R8290 Unspecified abnormal findings in urine: Secondary | ICD-10-CM

## 2014-03-05 DIAGNOSIS — R82998 Other abnormal findings in urine: Secondary | ICD-10-CM

## 2014-03-05 DIAGNOSIS — Z124 Encounter for screening for malignant neoplasm of cervix: Secondary | ICD-10-CM

## 2014-03-05 DIAGNOSIS — Z01419 Encounter for gynecological examination (general) (routine) without abnormal findings: Secondary | ICD-10-CM

## 2014-03-05 DIAGNOSIS — N951 Menopausal and female climacteric states: Secondary | ICD-10-CM

## 2014-03-05 DIAGNOSIS — Z0000 Encounter for general adult medical examination without abnormal findings: Secondary | ICD-10-CM

## 2014-03-05 LAB — POCT URINALYSIS DIPSTICK
Blood, UA: 2
LEUKOCYTES UA: NEGATIVE
UROBILINOGEN UA: NEGATIVE
pH, UA: 5

## 2014-03-05 NOTE — Progress Notes (Signed)
53 y.o. Married Caucasian female   G2P2002 here for annual exam. Pt reports menses are absent due to Ablation. She does report hot flashes, does have night sweats, does not have vaginal dryness.  She is not using lubricants.  She does not report post-menopasual bleeding.  Pt reports spotting for the first year after her ablation but none since.  Hot flashes intermittent for the past 19m sleep interrupted.  Pt just diagnosed with COPD noticed SOB with exercise.  Sister with breast cancer at 37 recurred 3x, now with multiple myeloma. BRCA negative.  Mother with ovarian cancer  Patient's last menstrual period was 03/05/2008.          Sexually active: yes  The current method of family planning is tubal ligation and Ablation.    Exercising: yes  cardio/qd  Last pap: 4 years ago- WNL Abnormal PAP: no Mammogram: never had one BSE: yes Colonoscopy: no DEXA: no Alcohol: once q 6 months Tobacco: no  Pcp: SWindell Hummingbird PA ; Urine: Blood 2  Health Maintenance  Topic Date Due  . Pap Smear  05/30/1979  . Mammogram  05/30/2011  . Colonoscopy  05/30/2011  . Influenza Vaccine  07/26/2013  . Tetanus/tdap  07/19/2022    Family History  Problem Relation Age of Onset  . Anxiety disorder Mother   . Colitis Mother   . Ovarian cancer Mother   . Cancer Mother   . Skin cancer Father   . Breast cancer Sister 37    x3  . Anxiety disorder Brother   . Anxiety disorder Daughter   . Prostate cancer Father   . Stroke Maternal Aunt   . Heart disease Paternal Grandmother   . Heart disease Paternal Aunt   . Heart attack Maternal Grandfather   . Hypertension Mother   . Hypertension Maternal Grandfather   . Hypertension Brother     Patient Active Problem List   Diagnosis Date Noted  . GERD (gastroesophageal reflux disease) 12/13/2012  . Anxiety 12/13/2012  . HTN (hypertension) 12/13/2012    Past Medical History  Diagnosis Date  . Anxiety   . Hypertension   . GERD (gastroesophageal reflux  disease)     Past Surgical History  Procedure Laterality Date  . Ablation  6 Years ago  . Cesarean section  1985; 1987    x2  . Tubal ligation  1987  . Reconstruction of nose  1991    Allergies: Codeine  Current Outpatient Prescriptions  Medication Sig Dispense Refill  . albuterol (PROVENTIL HFA;VENTOLIN HFA) 108 (90 BASE) MCG/ACT inhaler Inhale 2 puffs into the lungs every 6 (six) hours as needed for wheezing or shortness of breath.  1 Inhaler  0  . diazepam (VALIUM) 5 MG tablet Take 1-2 tablets (5-10 mg total) by mouth at bedtime as needed.  30 tablet  0  . hydrochlorothiazide (HYDRODIURIL) 25 MG tablet Take 0.5-1 tablets (12.5-25 mg total) by mouth daily.  90 tablet  1  . lisinopril (PRINIVIL,ZESTRIL) 10 MG tablet Take 1 tablet (10 mg total) by mouth daily.  90 tablet  1  . mometasone-formoterol (DULERA) 100-5 MCG/ACT AERO Inhale 2 puffs into the lungs 2 (two) times daily.  13 g  1  . omeprazole (PRILOSEC) 20 MG capsule Take 1 capsule (20 mg total) by mouth daily.  90 capsule  4   No current facility-administered medications for this visit.    ROS: Pertinent items are noted in HPI.  Exam:    BP 117/82  Pulse 73  Resp 16  Ht _0  (1.676 m)  Wt 186 lb (84.369 kg)  BMI 30.04 kg/m2  LMP 03/05/2008 Weight change: _1 @ Last 3 height recordings:  Ht Readings from Last 3 Encounters:  03/05/14 _2  (1.676 m)  02/26/14 5' 6.5" (1.689 m)  08/28/13 _3  (1.702 m)   General appearance: alert, cooperative and appears stated age Head: Normocephalic, without obvious abnormality, atraumatic Neck: no adenopathy, no carotid bruit, no JVD, supple, symmetrical, trachea midline and thyroid not enlarged, symmetric, no tenderness/mass/nodules Lungs: clear to auscultation bilaterally Breasts: normal appearance, no masses or tenderness Heart: regular rate and rhythm, S1, S2 normal, no murmur, click, rub or gallop Abdomen: soft, non-tender; bowel sounds normal; no masses,  no  organomegaly Extremities: extremities normal, atraumatic, no cyanosis or edema Skin: Skin color, texture, turgor normal. No rashes or lesions Lymph nodes: Cervical, supraclavicular, and axillary nodes normal. no inguinal nodes palpated Neurologic: Grossly normal   Pelvic: External genitalia:  no lesions              Urethra: normal appearing urethra with no masses, tenderness or lesions              Bartholins and Skenes: Bartholin's, Urethra, Skene's normal                 Vagina: normal appearing vagina with normal color and discharge, no lesions              Cervix: normal appearance              Pap taken: yes        Bimanual Exam:  Uterus:  uterus is normal size, shape, consistency and nontender                                      Adnexa:    normal adnexa in size, nontender and no masses                                      Rectovaginal: Confirms                                      Anus:  normal sphincter tone, no lesions  A: well woman Menopausal symptoms Malodorous urine     P: mammogram stressed, may need MRI PUS Baseline colonoscopy pap smear with HRHPV, guidelines reviewed Discussed dietary changes, avoid spicy food, EtOH, layered cool clothing, not interested in medications Urine culture counseled on breast self exam, mammography screening, menopause, adequate intake of calcium and vitamin D, diet and exercise return annually or prn Discussed PAP guideline changes, importance of weight bearing exercises, calcium, vit D and balanced diet.  An After Visit Summary was printed and given to the patient.

## 2014-03-05 NOTE — Telephone Encounter (Signed)
She has been on the lisinopril since Sept - it would be an unusual SE of that medication and with her abnormal spirometry it would be more likely her lungs.  If she would like to stop the lisinopril to see if it helps that would be fine.  The most common side effect of lisinopril is an irritating cough and not SOB.

## 2014-03-05 NOTE — Patient Instructions (Signed)

## 2014-03-05 NOTE — Telephone Encounter (Signed)
Patient Responses     Chl Mychart After Visit Questionnaire    Question 03/05/2014 5:50 PM   How are you feeling after your recent visit? Okay   Does the recommended course of treatment seem to be helping your symptoms? Not yet   Are you experiencing any side effects from your recommended treatment? Made my heart pound    is there anything else you would like to ask your physician? Yes I tried to call back to see if it is possible that the Lisinopril could make me short of breath. Seems like it didn't start until I started taking it.        Please advise. Thanks

## 2014-03-06 NOTE — Telephone Encounter (Signed)
Gave pt message. She will keep taing the lisinopril and see Judson Roch on April 1st.

## 2014-03-07 ENCOUNTER — Telehealth: Payer: Self-pay | Admitting: Gynecology

## 2014-03-07 LAB — URINE CULTURE
Colony Count: NO GROWTH
ORGANISM ID, BACTERIA: NO GROWTH

## 2014-03-07 NOTE — Telephone Encounter (Signed)
Spoke with patient. Advised that per insurance quote received pr for PUS is $10. Scheduled PUS. Advised of cancellation policy and cancellation fee. Patient agreeable.

## 2014-03-11 LAB — IPS PAP TEST WITH HPV

## 2014-03-21 ENCOUNTER — Telehealth: Payer: Self-pay | Admitting: Gynecology

## 2014-03-21 NOTE — Telephone Encounter (Signed)
Per fax received from Dr. Youlanda Mighty office. This patient is scheduled 04.16 @ 1600. Patient has been advised.

## 2014-03-24 ENCOUNTER — Ambulatory Visit
Admission: RE | Admit: 2014-03-24 | Discharge: 2014-03-24 | Disposition: A | Discharge status: Home / Self Care | Payer: BC Managed Care – PPO | Source: Ambulatory Visit | Attending: Gynecology | Admitting: Gynecology

## 2014-03-24 ENCOUNTER — Other Ambulatory Visit: Payer: Self-pay | Admitting: Gynecology

## 2014-03-24 DIAGNOSIS — Z803 Family history of malignant neoplasm of breast: Secondary | ICD-10-CM

## 2014-03-24 DIAGNOSIS — Z0000 Encounter for general adult medical examination without abnormal findings: Secondary | ICD-10-CM

## 2014-03-24 DIAGNOSIS — Z1231 Encounter for screening mammogram for malignant neoplasm of breast: Secondary | ICD-10-CM

## 2014-03-26 ENCOUNTER — Ambulatory Visit: Payer: BC Managed Care – PPO | Admitting: Physician Assistant

## 2014-04-30 ENCOUNTER — Ambulatory Visit: Payer: BC Managed Care – PPO | Admitting: Physician Assistant

## 2014-05-06 ENCOUNTER — Ambulatory Visit (INDEPENDENT_AMBULATORY_CARE_PROVIDER_SITE_OTHER): Payer: BC Managed Care – PPO

## 2014-05-06 ENCOUNTER — Ambulatory Visit (INDEPENDENT_AMBULATORY_CARE_PROVIDER_SITE_OTHER): Payer: BC Managed Care – PPO | Admitting: Gynecology

## 2014-05-06 ENCOUNTER — Encounter: Payer: Self-pay | Admitting: Gynecology

## 2014-05-06 VITALS — BP 100/72 | Ht 66.0 in | Wt 182.0 lb

## 2014-05-06 DIAGNOSIS — Z8041 Family history of malignant neoplasm of ovary: Secondary | ICD-10-CM

## 2014-05-06 DIAGNOSIS — Z803 Family history of malignant neoplasm of breast: Secondary | ICD-10-CM

## 2014-05-06 NOTE — Progress Notes (Signed)
Pt here for PUS due to history of breast cancer in sister and ovarian in mother. Sister BRCA negative 5y ago?  Images reviewed with pt, normal appearing uterus, ovaries and no free fluid noted. Pt had normal mammogram. We discussed her options.  We could continue to do annual PUS, mammogram and to consider breast MRI. She could consider removal of tubes only.  Discussed familial ovarian cancer, limitations of PUS and Ca125 screening. Pt prefers to continue as is with PUS/mammogram.  We encourage her sister to persue if any new testing is available. Pt aware that some hereditary cancers will not be picked up with BRCA screening. Questions addressed. 30mspent counseling, >50% face to face

## 2014-05-07 ENCOUNTER — Encounter: Payer: Self-pay | Admitting: Physician Assistant

## 2014-05-14 ENCOUNTER — Ambulatory Visit: Payer: BC Managed Care – PPO | Admitting: Physician Assistant

## 2014-05-28 ENCOUNTER — Ambulatory Visit (INDEPENDENT_AMBULATORY_CARE_PROVIDER_SITE_OTHER): Payer: BC Managed Care – PPO | Admitting: Physician Assistant

## 2014-05-28 ENCOUNTER — Encounter: Payer: Self-pay | Admitting: Physician Assistant

## 2014-05-28 VITALS — BP 126/90 | HR 78 | Temp 98.9°F | Resp 16 | Ht 66.5 in | Wt 180.0 lb

## 2014-05-28 DIAGNOSIS — R0602 Shortness of breath: Secondary | ICD-10-CM

## 2014-05-29 NOTE — Progress Notes (Signed)
   Subjective:    Patient ID: Carrie Dillon, female    DOB: 03-22-1961, 53 y.o.   MRN: [id number]  HPI Pt presents to clinic for a recheck of her SOB.  She has had no other episodes of SOB and she tried to use the [facility] and albuterol but they both made her feel terrible.  She is able to walk without a problem in [place].    Review of Systems     Objective:   Physical Exam  Vitals reviewed. Constitutional: She is oriented to person, place, and time. She appears well-developed and well-nourished.  HENT:  Head: Normocephalic and atraumatic.  Right Ear: External ear normal.  Left Ear: External ear normal.  Cardiovascular: Normal rate, regular rhythm and normal heart sounds.   No murmur heard. Pulmonary/Chest: Effort normal.  Prolonged expiration  Neurological: She is alert and oriented to person, place, and time.  Skin: Skin is warm and dry.  Psychiatric: She has a normal mood and affect. Her behavior is normal. Judgment and thought content normal.   Spirometry - last visit she had sever obstruction but we learned today that she performed the test >10 times and was never given full instructions on how to perform the test after her test today.  Today she has only mild obstruction which would makes sense with her symptoms.  She is a former smoker.     Assessment & Plan:  SOB (shortness of breath) - mild obstruction on spirometry - we will continue to monitor and she has further problems we will evaluate them at the time of occurrence.  We will recheck in 6 months.  Windell Hummingbird PA-C  [facility] 05/29/2014 2:23 PM

## 2014-06-24 ENCOUNTER — Encounter: Payer: Self-pay | Admitting: Gynecology

## 2014-09-04 ENCOUNTER — Encounter: Payer: Self-pay | Admitting: Physician Assistant

## 2014-09-04 DIAGNOSIS — I10 Essential (primary) hypertension: Secondary | ICD-10-CM

## 2014-09-04 DIAGNOSIS — M7989 Other specified soft tissue disorders: Secondary | ICD-10-CM

## 2014-09-04 MED ORDER — LISINOPRIL 10 MG PO TABS: 10.0000 mg | ORAL_TABLET | Freq: Every day | ORAL | Status: DC

## 2014-09-04 MED ORDER — HYDROCHLOROTHIAZIDE 25 MG PO TABS: 12.5000 mg | ORAL_TABLET | Freq: Every day | ORAL | Status: DC

## 2014-09-11 ENCOUNTER — Other Ambulatory Visit: Payer: Self-pay | Admitting: *Deleted

## 2014-09-11 DIAGNOSIS — K2100 Gastro-esophageal reflux disease with esophagitis, without bleeding: Secondary | ICD-10-CM

## 2014-09-11 MED ORDER — OMEPRAZOLE 20 MG PO CPDR: 20.0000 mg | DELAYED_RELEASE_CAPSULE | Freq: Every day | ORAL | Status: DC

## 2014-09-11 NOTE — Telephone Encounter (Signed)
Received fax from pharmacy to refill omeprazole 20 mg tab. Pt has appt in Dec.

## 2014-09-19 ENCOUNTER — Encounter: Payer: Self-pay | Admitting: Physician Assistant

## 2014-09-19 ENCOUNTER — Telehealth: Payer: Self-pay

## 2014-09-19 ENCOUNTER — Other Ambulatory Visit: Payer: Self-pay | Admitting: Physician Assistant

## 2014-09-19 DIAGNOSIS — F419 Anxiety disorder, unspecified: Secondary | ICD-10-CM

## 2014-09-19 NOTE — Telephone Encounter (Signed)
Pt was prescribed vallum by Windell Hummingbird, states that her husband just lost his job, and would like to know if she could have a refill on this medication, please advise [phone]

## 2014-09-20 MED ORDER — DIAZEPAM 5 MG PO TABS: 5.0000 mg | ORAL_TABLET | Freq: Every evening | ORAL | Status: DC | PRN

## 2014-09-20 NOTE — Telephone Encounter (Signed)
PLease call into the pharmacy.

## 2014-09-20 NOTE — Telephone Encounter (Signed)
Carrie Dillon   Patient called stated it was a personal call.  Please call her  [phone]

## 2014-09-23 NOTE — Telephone Encounter (Signed)
I wrote this on 9/26 and sent through a mychart message.

## 2014-10-27 ENCOUNTER — Encounter: Payer: Self-pay | Admitting: Physician Assistant

## 2014-11-25 ENCOUNTER — Telehealth: Payer: Self-pay

## 2014-11-25 NOTE — Telephone Encounter (Signed)
S/W pt about rescheduling AEX with Dr. Charlies Constable. Pt states " I'll hold off for now". Informed pt that we would schedule her with another provider and the pt refused.

## 2014-11-27 ENCOUNTER — Ambulatory Visit: Payer: BC Managed Care – PPO | Admitting: Physician Assistant

## 2014-12-03 ENCOUNTER — Ambulatory Visit: Payer: BC Managed Care – PPO | Admitting: Family Medicine

## 2014-12-06 ENCOUNTER — Other Ambulatory Visit: Payer: Self-pay | Admitting: Physician Assistant

## 2014-12-09 ENCOUNTER — Other Ambulatory Visit: Payer: Self-pay | Admitting: Physician Assistant

## 2014-12-10 ENCOUNTER — Ambulatory Visit: Payer: BC Managed Care – PPO | Admitting: Family Medicine

## 2014-12-14 ENCOUNTER — Other Ambulatory Visit: Payer: Self-pay | Admitting: Physician Assistant

## 2015-01-07 ENCOUNTER — Ambulatory Visit (INDEPENDENT_AMBULATORY_CARE_PROVIDER_SITE_OTHER): Payer: BLUE CROSS/BLUE SHIELD | Admitting: Family Medicine

## 2015-01-07 ENCOUNTER — Encounter: Payer: Self-pay | Admitting: Family Medicine

## 2015-01-07 VITALS — BP 130/70 | HR 86 | Temp 98.7°F | Resp 16 | Ht 66.0 in | Wt 184.0 lb

## 2015-01-07 DIAGNOSIS — J309 Allergic rhinitis, unspecified: Secondary | ICD-10-CM

## 2015-01-07 DIAGNOSIS — M7989 Other specified soft tissue disorders: Secondary | ICD-10-CM

## 2015-01-07 DIAGNOSIS — K219 Gastro-esophageal reflux disease without esophagitis: Secondary | ICD-10-CM

## 2015-01-07 DIAGNOSIS — I10 Essential (primary) hypertension: Secondary | ICD-10-CM

## 2015-01-07 LAB — BASIC METABOLIC PANEL
BUN: 13 mg/dL (ref 6–23)
CHLORIDE: 101 meq/L (ref 96–112)
CO2: 28 mEq/L (ref 19–32)
Calcium: 9.9 mg/dL (ref 8.4–10.5)
Creat: 0.79 mg/dL (ref 0.50–1.10)
GLUCOSE: 108 mg/dL — AB (ref 70–99)
POTASSIUM: 3.6 meq/L (ref 3.5–5.3)
Sodium: 140 mEq/L (ref 135–145)

## 2015-01-07 NOTE — Progress Notes (Signed)
Subjective:    Patient ID: Carrie Dillon, female    DOB: 15-Nov-1961, 54 y.o.   MRN: [id number]  HPI This is a very pleasant patient who presents today for follow up of her blood pressure. She has been tolerating her lisinopril and hctz without side effects and when she takes her bp at home, it is WNL.   She has had intermittent left flank and left calf pain for several years. It is very intermittent in nature. She is unable to determine what brings it on. It is sharp and doesn't last long (a couple of minutes). She may have several episodes in one day and then go weeks without symptoms. She denies any previous injury to either area. She sits all day at a desk and wonders if this contributes to her symptoms. The pain resolves spontaneously.   Is concerned about lung cancer and is interested in screening. Started smoking at age 26 and quit 2 years ago. She smoked 2.5 ppd.   She has noticed year round nasal congestion and ear pressure. Clear drainage. No watery/itchy eyes. Intermittent sinus pressure and ear pressure. Occasionally uses otc antihistamine with ok results.   She had been placed on Dulera previously, but does not feel she needs anything for her breathing. She does not have SOB or wheezing. She is able to perform all her normal activities without difficulty.   She would like to loose weight. Has gained 30 pounds with smoking cessation. Lacks consistent motivation for weight loss.  Review of Systems  HENT: Positive for congestion, rhinorrhea and sinus pressure. Negative for ear pain, sore throat and trouble swallowing.   Eyes: Negative for discharge.  Respiratory: Negative for cough, chest tightness and shortness of breath.   Cardiovascular: Negative for chest pain and leg swelling.  Musculoskeletal: Positive for myalgias.      Objective:   Physical Exam  Constitutional: She is oriented to person, place, and time. She appears well-developed and well-nourished. No distress.    HENT:  Head: Normocephalic and atraumatic.  Right Ear: External ear normal.  Left Ear: External ear normal.  Nose: Nose normal.  Mouth/Throat: Oropharynx is clear and moist. No oropharyngeal exudate.  Eyes: Conjunctivae are normal.  Neck: Normal range of motion. Neck supple.  Cardiovascular: Normal rate, regular rhythm and normal heart sounds.   Pulmonary/Chest: Effort normal and breath sounds normal.  Musculoskeletal: Normal range of motion. She exhibits no edema or tenderness.  Lymphadenopathy:    She has no cervical adenopathy.  Neurological: She is alert and oriented to person, place, and time.  Skin: Skin is warm and dry. She is not diaphoretic.  Psychiatric: She has a normal mood and affect. Her behavior is normal. Judgment and thought content normal.  Vitals reviewed. BP 130/70 mmHg  Pulse 86  Temp(Src) 98.7 F (37.1 C) (Oral)  Resp 16  Ht 5\' 6"  (1.676 m)  Wt 184 lb (83.462 kg)  BMI 29.71 kg/m2  SpO2 96%    Assessment & Plan:  1. Essential hypertension - Basic metabolic panel - hydrochlorothiazide (HYDRODIURIL) 25 MG tablet; Take 0.5-1 tablets (12.5-25 mg total) by mouth daily.  Dispense: 90 tablet; Refill: 1 - lisinopril (PRINIVIL,ZESTRIL) 10 MG tablet; Take 1 tablet (10 mg total) by mouth daily.  Dispense: 90 tablet; Refill: 1  2. Allergic rhinitis, unspecified allergic rhinitis type - fluticasone (FLONASE) 50 MCG/ACT nasal spray; Place 2 sprays into both nostrils daily.  Dispense: 16 g; Refill: 6  3. Bilateral swelling of feet - hydrochlorothiazide (HYDRODIURIL)  25 MG tablet; Take 0.5-1 tablets (12.5-25 mg total) by mouth daily.  Dispense: 90 tablet; Refill: 1  4. Gastroesophageal reflux disease without esophagitis - omeprazole (PRILOSEC) 20 MG capsule; TAKE ONE CAPSULE BY MOUTH ONCE DAILY  Dispense: 90 capsule; Refill: 1  -Follow up in 6 months for CPE  Elby Beck, FNP-BC  [facility]  01/08/2015 8:09  PM

## 2015-01-07 NOTE — Patient Instructions (Signed)

## 2015-01-08 MED ORDER — FLUTICASONE PROPIONATE 50 MCG/ACT NA SUSP: 2.0000 | Freq: Every day | NASAL | Status: DC

## 2015-01-08 MED ORDER — OMEPRAZOLE 20 MG PO CPDR: DELAYED_RELEASE_CAPSULE | ORAL | Status: DC

## 2015-01-08 MED ORDER — LISINOPRIL 10 MG PO TABS: 10.0000 mg | ORAL_TABLET | Freq: Every day | ORAL | Status: DC

## 2015-01-08 MED ORDER — HYDROCHLOROTHIAZIDE 25 MG PO TABS: 12.5000 mg | ORAL_TABLET | Freq: Every day | ORAL | Status: DC

## 2015-01-09 ENCOUNTER — Telehealth: Payer: Self-pay

## 2015-01-09 NOTE — Telephone Encounter (Signed)
Carrie Dillon, I got a fax from pharm requesting clarification of sig for HCTZ. They evidently don't like the 1/2 - 1 tablet dosing and are asking if you want pt to take 1/2 tab or 1 whole tab daily. Please advise. If she does vary dosage depending upon her BP readings, I will be happy to tell them that.

## 2015-01-09 NOTE — Telephone Encounter (Signed)
I think she takes it based on edema. The sig can state 1/2-1.

## 2015-01-12 NOTE — Telephone Encounter (Signed)
Called pharm and LM w/instr's to leave as it was written, pt varies according to amount of edema.

## 2015-03-05 ENCOUNTER — Telehealth: Payer: Self-pay

## 2015-03-05 NOTE — Telephone Encounter (Signed)
Called pt, left a message to call me back. What should I advise pt when she calls back?

## 2015-03-05 NOTE — Telephone Encounter (Signed)
If her BP is that low and she is symptomatic - she should stop both her BP medications and monitor her BP for several days and send me a message through [place] with her readings and we will adjust her medications as needed.

## 2015-03-05 NOTE — Telephone Encounter (Signed)
Has been taking her BP meds as normal (lisinopril and HCTZ) as normal but feeling a little dizzy the past couple days (96/63 and 93/64) this morning before taking meds (did not take today).  Carrie Dillon  [phone]

## 2015-03-05 NOTE — Telephone Encounter (Signed)
Patient says she cannot wait to hear back from [place] regarding her previous call. She has scheduled an appt with Dr Marin Comment for tomorrow.

## 2015-03-05 NOTE — Telephone Encounter (Signed)
Spoke with patient, patient understands plan

## 2015-03-05 NOTE — Telephone Encounter (Signed)
Pt called back to make sure that her message was still in the system, states she would still like to hear back from Windell Hummingbird

## 2015-03-06 ENCOUNTER — Ambulatory Visit: Payer: BLUE CROSS/BLUE SHIELD | Admitting: Family Medicine

## 2015-03-06 ENCOUNTER — Encounter: Payer: Self-pay | Admitting: Physician Assistant

## 2015-03-09 ENCOUNTER — Ambulatory Visit: Payer: BC Managed Care – PPO | Admitting: Gynecology

## 2015-03-11 ENCOUNTER — Ambulatory Visit: Payer: BC Managed Care – PPO | Admitting: Gynecology

## 2015-03-30 ENCOUNTER — Ambulatory Visit: Payer: BLUE CROSS/BLUE SHIELD | Admitting: Family Medicine

## 2015-04-27 ENCOUNTER — Telehealth: Payer: Self-pay

## 2015-04-27 NOTE — Telephone Encounter (Signed)
Pt states she would like [place] to fax over a prescription for OMEPRAZOLE 20 MG to

## 2015-05-07 ENCOUNTER — Other Ambulatory Visit: Payer: Self-pay

## 2015-05-07 DIAGNOSIS — K219 Gastro-esophageal reflux disease without esophagitis: Secondary | ICD-10-CM

## 2015-05-07 MED ORDER — OMEPRAZOLE 20 MG PO CPDR: DELAYED_RELEASE_CAPSULE | ORAL | Status: DC

## 2015-05-07 NOTE — Telephone Encounter (Signed)
Spoke with pt, she needed a Rx [facility] to [facility]. Rx sent.

## 2015-05-15 ENCOUNTER — Ambulatory Visit (INDEPENDENT_AMBULATORY_CARE_PROVIDER_SITE_OTHER): Payer: BLUE CROSS/BLUE SHIELD | Admitting: Physician Assistant

## 2015-05-15 VITALS — BP 108/70 | HR 70 | Temp 98.7°F | Resp 16 | Ht 66.0 in | Wt 172.4 lb

## 2015-05-15 DIAGNOSIS — M7989 Other specified soft tissue disorders: Secondary | ICD-10-CM | POA: Diagnosis not present

## 2015-05-15 DIAGNOSIS — E871 Hypo-osmolality and hyponatremia: Secondary | ICD-10-CM

## 2015-05-15 DIAGNOSIS — M79672 Pain in left foot: Secondary | ICD-10-CM

## 2015-05-15 DIAGNOSIS — I10 Essential (primary) hypertension: Secondary | ICD-10-CM

## 2015-05-15 DIAGNOSIS — M79675 Pain in left toe(s): Secondary | ICD-10-CM

## 2015-05-15 NOTE — Patient Instructions (Signed)
Increase your prilosec to see if that will helps with the throat sensation.  You can try support socks to help with the swelling.  Continue elevating your feet and exercising to help with your weight but it will also help with the swelling.

## 2015-05-15 NOTE — Progress Notes (Signed)
   Subjective:    Patient ID: Carrie Dillon, female    DOB: 10-29-1961, 54 y.o.   MRN: [id number]  HPI Pt presents to clinic with left back of knee and foot swelling and aching.  It has been going on a while but she did not want to wait until her appt in July to have it checked.  She typically gets home and elevated her foot and that helps with the swelling but it is not helping as much.  She has been trying to walk during her 2 1- 10 minute breaks and her 30 min lunch.  She has been trying to lose weight by decreasing her calories and the above exercise.  She has been successful.  Her BP readings are from 90/60 to 140/80s.  She takes her BP daily.  She is prepared to give stem cells for her sister in June.  Review of Systems  Constitutional: Negative for fever and chills.  Respiratory: Negative for cough, shortness of breath and wheezing.   Musculoskeletal: Negative for joint swelling and gait problem.       Objective:   Physical Exam  Constitutional: She is oriented to person, place, and time. She appears well-developed and well-nourished.  BP 108/70 mmHg  Pulse 70  Temp(Src) 98.7 F (37.1 C) (Oral)  Resp 16  Ht 5\' 6"  (1.676 m)  Wt 172 lb 6.4 oz (78.2 kg)  BMI 27.84 kg/m2  SpO2 98%   HENT:  Head: Normocephalic and atraumatic.  Right Ear: External ear normal.  Left Ear: External ear normal.  Cardiovascular: Normal rate, regular rhythm and normal heart sounds.   No murmur heard. Pulmonary/Chest: Effort normal and breath sounds normal. She has no wheezes.  Musculoskeletal:       Left knee: Normal.       Lumbar back: Normal.  Minimal swelling left foot.  No pitting edema on left leg.  Good pulses.  Cap refill <3secs.  No swelling posterior left knee.  Neurological: She is alert and oriented to person, place, and time. She has normal strength. No sensory deficit.  Skin: Skin is warm and dry.  Psychiatric: She has a normal mood and affect. Her behavior is normal. Judgment and  thought content normal.       Assessment & Plan:  Essential hypertension - Plan: COMPLETE METABOLIC PANEL WITH GFR  Pain and swelling of toe of left foot - Plan: Ambulatory referral to Vascular Surgery   Due to the intermittent nature of the knee pain and swelling that is not related to her leg swelling we will continue to monitor that.  She will try support socks to see if that will help her swelling during the day. We will do a referral to vein specialist to see if this is a vascular problem due to the fact that it is unilateral.  Windell Hummingbird PA-C  [facility] 05/15/2015 6:17 PM

## 2015-05-16 LAB — COMPLETE METABOLIC PANEL WITH GFR
ALT: 11 U/L (ref 0–35)
AST: 14 U/L (ref 0–37)
Albumin: 4.7 g/dL (ref 3.5–5.2)
Alkaline Phosphatase: 66 U/L (ref 39–117)
BUN: 11 mg/dL (ref 6–23)
CO2: 24 mEq/L (ref 19–32)
CREATININE: 0.83 mg/dL (ref 0.50–1.10)
Calcium: 10 mg/dL (ref 8.4–10.5)
Chloride: 97 mEq/L (ref 96–112)
GFR, EST NON AFRICAN AMERICAN: 81 mL/min
Glucose, Bld: 96 mg/dL (ref 70–99)
Potassium: 3.7 mEq/L (ref 3.5–5.3)
Sodium: 132 mEq/L — ABNORMAL LOW (ref 135–145)
TOTAL PROTEIN: 6.9 g/dL (ref 6.0–8.3)
Total Bilirubin: 0.7 mg/dL (ref 0.2–1.2)

## 2015-05-17 NOTE — Addendum Note (Signed)
Addended by: Windell Hummingbird L on: 05/17/2015 11:04 AM   Modules accepted: Orders, SmartSet

## 2015-05-18 ENCOUNTER — Encounter: Payer: Self-pay | Admitting: Physician Assistant

## 2015-05-22 DIAGNOSIS — Z52001 Unspecified donor, stem cells: Secondary | ICD-10-CM | POA: Insufficient documentation

## 2015-06-17 ENCOUNTER — Encounter: Payer: BLUE CROSS/BLUE SHIELD | Admitting: Physician Assistant

## 2015-06-17 ENCOUNTER — Encounter: Payer: Self-pay | Admitting: Physician Assistant

## 2015-07-01 ENCOUNTER — Other Ambulatory Visit: Payer: Self-pay | Admitting: Physician Assistant

## 2015-07-01 DIAGNOSIS — K219 Gastro-esophageal reflux disease without esophagitis: Secondary | ICD-10-CM

## 2015-07-01 MED ORDER — OMEPRAZOLE 20 MG PO CPDR: DELAYED_RELEASE_CAPSULE | ORAL | Status: DC

## 2015-07-14 ENCOUNTER — Encounter: Payer: Self-pay | Admitting: Physician Assistant

## 2015-07-15 ENCOUNTER — Other Ambulatory Visit: Payer: Self-pay

## 2015-07-15 DIAGNOSIS — F419 Anxiety disorder, unspecified: Secondary | ICD-10-CM

## 2015-07-15 DIAGNOSIS — K219 Gastro-esophageal reflux disease without esophagitis: Secondary | ICD-10-CM

## 2015-07-15 MED ORDER — OMEPRAZOLE 20 MG PO CPDR: DELAYED_RELEASE_CAPSULE | ORAL | Status: DC

## 2015-07-15 NOTE — Telephone Encounter (Signed)
Resent Rx was not received.

## 2015-09-04 ENCOUNTER — Encounter: Payer: Self-pay | Admitting: Physician Assistant

## 2015-09-04 ENCOUNTER — Telehealth: Payer: Self-pay

## 2015-09-04 DIAGNOSIS — M7989 Other specified soft tissue disorders: Secondary | ICD-10-CM

## 2015-09-04 DIAGNOSIS — I10 Essential (primary) hypertension: Secondary | ICD-10-CM

## 2015-09-04 MED ORDER — HYDROCHLOROTHIAZIDE 25 MG PO TABS: 12.5000 mg | ORAL_TABLET | Freq: Every day | ORAL | Status: DC

## 2015-10-08 ENCOUNTER — Encounter: Payer: Self-pay | Admitting: Physician Assistant

## 2015-10-08 ENCOUNTER — Ambulatory Visit (INDEPENDENT_AMBULATORY_CARE_PROVIDER_SITE_OTHER): Payer: BLUE CROSS/BLUE SHIELD | Admitting: Physician Assistant

## 2015-10-08 VITALS — BP 123/85 | HR 64 | Temp 98.1°F | Resp 16 | Ht 66.0 in | Wt 165.0 lb

## 2015-10-08 DIAGNOSIS — I10 Essential (primary) hypertension: Secondary | ICD-10-CM

## 2015-10-08 DIAGNOSIS — M7989 Other specified soft tissue disorders: Secondary | ICD-10-CM

## 2015-10-08 DIAGNOSIS — F419 Anxiety disorder, unspecified: Secondary | ICD-10-CM | POA: Diagnosis not present

## 2015-10-08 DIAGNOSIS — E871 Hypo-osmolality and hyponatremia: Secondary | ICD-10-CM

## 2015-10-08 DIAGNOSIS — K219 Gastro-esophageal reflux disease without esophagitis: Secondary | ICD-10-CM | POA: Diagnosis not present

## 2015-10-08 DIAGNOSIS — R319 Hematuria, unspecified: Secondary | ICD-10-CM

## 2015-10-08 LAB — COMPLETE METABOLIC PANEL WITH GFR
ALBUMIN: 4.4 g/dL (ref 3.6–5.1)
ALK PHOS: 69 U/L (ref 33–130)
ALT: 13 U/L (ref 6–29)
AST: 16 U/L (ref 10–35)
BILIRUBIN TOTAL: 0.5 mg/dL (ref 0.2–1.2)
BUN: 12 mg/dL (ref 7–25)
CALCIUM: 9.8 mg/dL (ref 8.6–10.4)
CO2: 27 mmol/L (ref 20–31)
Chloride: 98 mmol/L (ref 98–110)
Creat: 0.73 mg/dL (ref 0.50–1.05)
GFR, Est African American: >89 mL/min (ref >=60)
GLUCOSE: 85 mg/dL (ref 65–99)
Potassium: 3.8 mmol/L (ref 3.5–5.3)
SODIUM: 136 mmol/L (ref 135–146)
TOTAL PROTEIN: 7 g/dL (ref 6.1–8.1)

## 2015-10-08 LAB — CBC WITH DIFFERENTIAL/PLATELET
BASOS PCT: 1 % (ref 0–1)
Basophils Absolute: 0.1 10*3/uL (ref 0.0–0.1)
Eosinophils Absolute: 0.1 10*3/uL (ref 0.0–0.7)
Eosinophils Relative: 1 % (ref 0–5)
HEMATOCRIT: 40.4 % (ref 36.0–46.0)
HEMOGLOBIN: 14.2 g/dL (ref 12.0–15.0)
LYMPHS ABS: 3.4 10*3/uL (ref 0.7–4.0)
LYMPHS PCT: 33 % (ref 12–46)
MCH: 31 pg (ref 26.0–34.0)
MCHC: 35.1 g/dL (ref 30.0–36.0)
MCV: 88.2 fL (ref 78.0–100.0)
MONO ABS: 1.1 10*3/uL — AB (ref 0.1–1.0)
MONOS PCT: 11 % (ref 3–12)
MPV: 9.4 fL (ref 8.6–12.4)
NEUTROS ABS: 5.6 10*3/uL (ref 1.7–7.7)
NEUTROS PCT: 54 % (ref 43–77)
Platelets: 432 10*3/uL — ABNORMAL HIGH (ref 150–400)
RBC: 4.58 MIL/uL (ref 3.87–5.11)
RDW: 12.5 % (ref 11.5–15.5)
WBC: 10.3 10*3/uL (ref 4.0–10.5)

## 2015-10-08 LAB — POCT URINALYSIS DIP (MANUAL ENTRY)
BILIRUBIN UA: NEGATIVE
BILIRUBIN UA: NEGATIVE
Glucose, UA: NEGATIVE
NITRITE UA: NEGATIVE
PH UA: 7
Protein Ur, POC: NEGATIVE
Spec Grav, UA: 1.01
Urobilinogen, UA: 0.2

## 2015-10-08 LAB — POC MICROSCOPIC URINALYSIS (UMFC): Mucus: ABSENT

## 2015-10-08 MED ORDER — LISINOPRIL 10 MG PO TABS: 10.0000 mg | ORAL_TABLET | Freq: Every day | ORAL | Status: DC

## 2015-10-08 MED ORDER — OMEPRAZOLE 20 MG PO CPDR: DELAYED_RELEASE_CAPSULE | ORAL | Status: DC

## 2015-10-08 MED ORDER — DIAZEPAM 5 MG PO TABS: 5.0000 mg | ORAL_TABLET | Freq: Every evening | ORAL | Status: DC | PRN

## 2015-10-08 MED ORDER — HYDROCHLOROTHIAZIDE 25 MG PO TABS: 12.5000 mg | ORAL_TABLET | Freq: Every day | ORAL | Status: DC

## 2015-10-08 NOTE — Progress Notes (Signed)
Carrie Dillon  MRN: [id number] DOB: Nov 13, 1961  Subjective:  Pt presents to clinic for recheck of labs.  When she was getting ready for her stem cell donation for her sister in June she had labs done but she has some questions about her results.  Her WBC were 68 and alkphos was elevated but she was on Neupogen.  She also had some trace blood on her urine dip and these worry her.  Her BP has been doing well since her last visit.  She lost some weight over the summer but she has gained a few lbs back.  2 days ago she has increased back pain with dysuria and gross blood while wiping - she only had it for 2 urination cycles and it has resolved but she would like to make sure she does not have a UTI.  Donated stem cells in June for her sister.  Feeling well since then.  Patient Active Problem List   Diagnosis Date Noted  . GERD (gastroesophageal reflux disease) 12/13/2012  . Anxiety 12/13/2012  . HTN (hypertension) 12/13/2012    No current outpatient prescriptions on file prior to visit.   No current facility-administered medications on file prior to visit.    Allergies  Allergen Reactions  . Codeine Nausea Only    Review of Systems  Constitutional: Negative.   HENT: Positive for congestion and sinus pressure.   Eyes: Negative.   Respiratory: Negative.   Cardiovascular: Negative.   Gastrointestinal: Negative.   Endocrine: Negative.   Genitourinary: Negative.   Musculoskeletal: Negative.   Skin: Negative.   Allergic/Immunologic: Negative.   Neurological: Negative.   Hematological: Negative.   Psychiatric/Behavioral: Negative.    Objective:  BP 123/85 mmHg  Pulse 64  Temp(Src) 98.1 F (36.7 C)  Resp 16  Ht 5\' 6"  (1.676 m)  Wt 165 lb (74.844 kg)  BMI 26.64 kg/m2  Physical Exam  Constitutional: She is oriented to person, place, and time and well-developed, well-nourished, and in no distress.  HENT:  Head: Normocephalic and atraumatic.  Right Ear: Hearing and  external ear normal.  Left Ear: Hearing and external ear normal.  Eyes: Conjunctivae are normal.  Neck: Normal range of motion.  Cardiovascular: Normal rate, regular rhythm and normal heart sounds.   No murmur heard. Pulmonary/Chest: Effort normal and breath sounds normal.  Abdominal: Soft. There is tenderness in the suprapubic area. There is no CVA tenderness.  Musculoskeletal:       Right lower leg: She exhibits no edema.       Left lower leg: She exhibits no edema.  Neurological: She is alert and oriented to person, place, and time. Gait normal.  Skin: Skin is warm and dry.  Psychiatric: Mood, memory, affect and judgment normal.  Vitals reviewed.  Results for orders placed or performed in visit on 10/08/15  POCT urinalysis dipstick  Result Value Ref Range   Color, UA light yellow (A) yellow   Clarity, UA cloudy (A) clear   Glucose, UA negative negative   Bilirubin, UA negative negative   Ketones, POC UA negative negative   Spec Grav, UA 1.010    Blood, UA trace-intact (A) negative   pH, UA 7.0    Protein Ur, POC negative negative   Urobilinogen, UA 0.2    Nitrite, UA Negative Negative   Leukocytes, UA Trace (A) Negative  POCT Microscopic Urinalysis ([facility])  Result Value Ref Range   WBC,UR,HPF,POC None None WBC/hpf   RBC,UR,HPF,POC None None RBC/hpf   Bacteria  None None   Mucus Absent Absent   Epithelial Cells, UR Per Microscopy Few (A) None cells/hpf     Assessment and Plan :  Hematuria - Plan: CBC with Differential/Platelet, POCT urinalysis dipstick, POCT Microscopic Urinalysis ([facility])  Hyponatremia - Plan: COMPLETE METABOLIC PANEL WITH GFR  Anxiety - Plan: diazepam (VALIUM) 5 MG tablet - pt has not used 30 in the last year - she feels like she just wants to have them in case but the ones that she has left are expired.  Essential hypertension - Plan: lisinopril (PRINIVIL,ZESTRIL) 10 MG tablet, hydrochlorothiazide (HYDRODIURIL) 25 MG tablet - she will continue to  monitor her BP - it is good today but not low enough that I want to decrease her medication - she has tried not taking the HCTZ but then her legs swell  Bilateral swelling of feet - Plan: hydrochlorothiazide (HYDRODIURIL) 25 MG tablet  Gastroesophageal reflux disease without esophagitis - Plan: omeprazole (PRILOSEC) 20 MG capsule  Windell Hummingbird PA-C  [facility] 10/08/2015 6:02 PM

## 2015-10-10 ENCOUNTER — Encounter: Payer: Self-pay | Admitting: Physician Assistant

## 2016-01-08 ENCOUNTER — Telehealth: Payer: Self-pay | Admitting: Family Medicine

## 2016-01-08 NOTE — Telephone Encounter (Signed)
Spoke with patient about her colonoscopy.  She would like to discuss it with Judson Roch on January 14, 2016.

## 2016-01-13 ENCOUNTER — Ambulatory Visit: Payer: Self-pay | Admitting: Physician Assistant

## 2016-01-14 ENCOUNTER — Ambulatory Visit (INDEPENDENT_AMBULATORY_CARE_PROVIDER_SITE_OTHER): Payer: BLUE CROSS/BLUE SHIELD | Admitting: Physician Assistant

## 2016-01-14 ENCOUNTER — Encounter: Payer: Self-pay | Admitting: Physician Assistant

## 2016-01-14 VITALS — BP 130/80 | HR 85 | Temp 98.7°F | Resp 16 | Ht 65.0 in | Wt 166.2 lb

## 2016-01-14 DIAGNOSIS — K219 Gastro-esophageal reflux disease without esophagitis: Secondary | ICD-10-CM | POA: Diagnosis not present

## 2016-01-14 DIAGNOSIS — I10 Essential (primary) hypertension: Secondary | ICD-10-CM

## 2016-01-14 DIAGNOSIS — D473 Essential (hemorrhagic) thrombocythemia: Secondary | ICD-10-CM

## 2016-01-14 DIAGNOSIS — Z1211 Encounter for screening for malignant neoplasm of colon: Secondary | ICD-10-CM | POA: Diagnosis not present

## 2016-01-14 DIAGNOSIS — R319 Hematuria, unspecified: Secondary | ICD-10-CM

## 2016-01-14 DIAGNOSIS — M7989 Other specified soft tissue disorders: Secondary | ICD-10-CM | POA: Diagnosis not present

## 2016-01-14 DIAGNOSIS — D75839 Thrombocytosis, unspecified: Secondary | ICD-10-CM

## 2016-01-14 LAB — POC MICROSCOPIC URINALYSIS (UMFC): MUCUS RE: ABSENT

## 2016-01-14 LAB — CBC WITH DIFFERENTIAL/PLATELET
Basophils Absolute: 0 10*3/uL (ref 0.0–0.1)
Basophils Relative: 0 % (ref 0–1)
EOS ABS: 0.1 10*3/uL (ref 0.0–0.7)
Eosinophils Relative: 1 % (ref 0–5)
HCT: 41.2 % (ref 36.0–46.0)
Hemoglobin: 14.1 g/dL (ref 12.0–15.0)
LYMPHS ABS: 3.3 10*3/uL (ref 0.7–4.0)
Lymphocytes Relative: 37 % (ref 12–46)
MCH: 31.1 pg (ref 26.0–34.0)
MCHC: 34.2 g/dL (ref 30.0–36.0)
MCV: 90.7 fL (ref 78.0–100.0)
MONO ABS: 1.2 10*3/uL — AB (ref 0.1–1.0)
MONOS PCT: 13 % — AB (ref 3–12)
MPV: 9.7 fL (ref 8.6–12.4)
NEUTROS PCT: 49 % (ref 43–77)
Neutro Abs: 4.4 10*3/uL (ref 1.7–7.7)
PLATELETS: 384 10*3/uL (ref 150–400)
RBC: 4.54 MIL/uL (ref 3.87–5.11)
RDW: 12.7 % (ref 11.5–15.5)
WBC: 9 10*3/uL (ref 4.0–10.5)

## 2016-01-14 LAB — POCT URINALYSIS DIP (MANUAL ENTRY)
BILIRUBIN UA: NEGATIVE
BILIRUBIN UA: NEGATIVE
Glucose, UA: NEGATIVE
LEUKOCYTES UA: NEGATIVE
Nitrite, UA: NEGATIVE
PH UA: 7
Protein Ur, POC: NEGATIVE
SPEC GRAV UA: 1.015
Urobilinogen, UA: 0.2

## 2016-01-14 MED ORDER — OMEPRAZOLE 20 MG PO CPDR: DELAYED_RELEASE_CAPSULE | ORAL | Status: DC

## 2016-01-14 MED ORDER — LISINOPRIL 10 MG PO TABS: 10.0000 mg | ORAL_TABLET | Freq: Every day | ORAL | Status: DC

## 2016-01-14 MED ORDER — HYDROCHLOROTHIAZIDE 25 MG PO TABS: 25.0000 mg | ORAL_TABLET | Freq: Every day | ORAL | Status: DC

## 2016-01-14 NOTE — Progress Notes (Signed)
   Carrie Dillon  MRN: [id number] DOB: [birth year]  Subjective:  Pt presents to clinic for HTN recheck and medication refill.  She also had some blood in her urine at last visit without symptoms and she would like that checked again and her platelets were slightly increased and they will be rechecked today.  She feels good - she did gain some weight over the holidays but she has stated eating healthy again and she has lost most of it.  She was hoping to go off of a BP medications but with the weight gain her BP has increased also.  Patient Active Problem List   Diagnosis Date Noted  . 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 GERD (gastroesophageal reflux disease) 12/13/2012  . Anxiety 12/13/2012  . HTN (hypertension) 12/13/2012    Current Outpatient Prescriptions on File Prior to Visit  Medication Sig Dispense Refill  . diazepam (VALIUM) 5 MG tablet Take 1-2 tablets (5-10 mg total) by mouth at bedtime as needed. 30 tablet 0   No current facility-administered medications on file prior to visit.    Allergies  Allergen Reactions  . Codeine Nausea Only    Review of Systems  Constitutional: Negative for fever and chills.  Respiratory: Negative for shortness of breath.   Cardiovascular: Negative for chest pain, palpitations and leg swelling.   Objective:  BP 130/80 mmHg  Pulse 85  Temp(Src) 98.7 F (37.1 C) (Oral)  Resp 16  Ht 5\' 5"  (1.651 m)  Wt 166 lb 3.2 oz (75.388 kg)  BMI 27.66 kg/m2  SpO2 95%  Physical Exam  Constitutional: She is oriented to person, place, and time and well-developed, well-nourished, and in no distress.  HENT:  Head: Normocephalic and atraumatic.  Right Ear: Hearing and external ear normal.  Left Ear: Hearing and external ear normal.  Eyes: Conjunctivae are normal.  Neck: Normal range of motion.  Cardiovascular: Normal rate, regular rhythm and normal heart sounds.   No murmur heard. Pulmonary/Chest: Effort normal and breath sounds normal.    Musculoskeletal:       Right lower leg: She exhibits no edema.       Left lower leg: She exhibits no edema.  Neurological: She is alert and oriented to person, place, and time. Gait normal.  Skin: Skin is warm and dry.  Psychiatric: Mood, memory, affect and judgment normal.  Vitals reviewed.   Assessment and Plan :  Hematuria - Plan: POCT Microscopic Urinalysis ([facility]), POCT urinalysis dipstick - recheck today - still some trace blood on dipstick but none on microscopic evaluation.  Thrombocytosis ([place]) - Plan: CBC with Differential/Platelet - recheck today  Colon cancer screening - Plan: IFOBT POC (occult bld, rslt in office) - pt is not interested in colonoscopy but she will do home hemosures.  She has seen something advertised on TV for colon cancer screening that is not a colonoscopy and she will look into that more and get me the name of that screening.  Essential hypertension - Plan: hydrochlorothiazide (HYDRODIURIL) 25 MG tablet, lisinopril (PRINIVIL,ZESTRIL) 10 MG tablet - continue to monitor - she will let me know if her BP starts to run low with her weight loss and we need to decrease the lisinopril.  Bilateral swelling of feet - Plan: hydrochlorothiazide (HYDRODIURIL) 25 MG tablet  Gastroesophageal reflux disease without esophagitis - Plan: omeprazole (PRILOSEC) 20 MG capsule  Recheck 6 months  Windell Hummingbird PA-C  [facility] 01/14/2016 5:44 PM

## 2016-01-21 ENCOUNTER — Encounter: Payer: Self-pay | Admitting: Physician Assistant

## 2016-01-21 DIAGNOSIS — M7989 Other specified soft tissue disorders: Secondary | ICD-10-CM

## 2016-01-21 DIAGNOSIS — I10 Essential (primary) hypertension: Secondary | ICD-10-CM

## 2016-02-29 ENCOUNTER — Telehealth: Payer: Self-pay

## 2016-02-29 ENCOUNTER — Encounter: Payer: Self-pay | Admitting: Physician Assistant

## 2016-02-29 NOTE — Telephone Encounter (Signed)
Carrie Dillon would you be willing to do this for patient?

## 2016-02-29 NOTE — Telephone Encounter (Signed)
Pt states that her grandchildren have the flu and she is now feverish/congested and would like tamiflu called in for her   Best phone for pt is [place]

## 2016-03-01 MED ORDER — OSELTAMIVIR PHOSPHATE 75 MG PO CAPS: 75.0000 mg | ORAL_CAPSULE | Freq: Two times a day (BID) | ORAL | Status: AC

## 2016-03-01 NOTE — Telephone Encounter (Signed)
I have sent it to the pharmacy 

## 2016-03-01 NOTE — Telephone Encounter (Signed)
Sent in tamiflu for patient - her granddaughter has it and she has been watching her with the flu and now she has symptoms.

## 2016-03-01 NOTE — Telephone Encounter (Signed)
Pt advised.

## 2016-05-06 NOTE — Telephone Encounter (Signed)
close

## 2016-06-29 ENCOUNTER — Encounter: Payer: Self-pay | Admitting: Physician Assistant

## 2016-06-29 ENCOUNTER — Other Ambulatory Visit: Payer: Self-pay | Admitting: Physician Assistant

## 2016-06-29 DIAGNOSIS — F419 Anxiety disorder, unspecified: Secondary | ICD-10-CM

## 2016-06-30 MED ORDER — DIAZEPAM 5 MG PO TABS: 5.0000 mg | ORAL_TABLET | Freq: Every evening | ORAL | Status: DC | PRN

## 2016-06-30 NOTE — Telephone Encounter (Signed)
Done - pt know through my chart

## 2016-06-30 NOTE — Telephone Encounter (Signed)
Faxed

## 2016-07-27 ENCOUNTER — Ambulatory Visit (INDEPENDENT_AMBULATORY_CARE_PROVIDER_SITE_OTHER): Payer: BLUE CROSS/BLUE SHIELD | Admitting: Physician Assistant

## 2016-07-27 ENCOUNTER — Encounter: Payer: Self-pay | Admitting: Physician Assistant

## 2016-07-27 VITALS — BP 122/78 | HR 75 | Temp 97.9°F | Resp 18 | Ht 66.5 in | Wt 167.0 lb

## 2016-07-27 DIAGNOSIS — K219 Gastro-esophageal reflux disease without esophagitis: Secondary | ICD-10-CM

## 2016-07-27 DIAGNOSIS — I10 Essential (primary) hypertension: Secondary | ICD-10-CM | POA: Diagnosis not present

## 2016-07-27 DIAGNOSIS — Z13220 Encounter for screening for lipoid disorders: Secondary | ICD-10-CM | POA: Diagnosis not present

## 2016-07-27 LAB — LIPID PANEL
Cholesterol: 179 mg/dL (ref 125–200)
HDL: 62 mg/dL (ref >=46)
LDL Cholesterol: 103 mg/dL (ref <130)
TRIGLYCERIDES: 70 mg/dL (ref <150)
Total CHOL/HDL Ratio: 2.9 Ratio (ref <=5.0)
VLDL: 14 mg/dL (ref <30)

## 2016-07-27 LAB — COMPLETE METABOLIC PANEL WITH GFR
ALBUMIN: 4.2 g/dL (ref 3.6–5.1)
ALK PHOS: 70 U/L (ref 33–130)
ALT: 14 U/L (ref 6–29)
AST: 15 U/L (ref 10–35)
BILIRUBIN TOTAL: 0.5 mg/dL (ref 0.2–1.2)
BUN: 14 mg/dL (ref 7–25)
CO2: 30 mmol/L (ref 20–31)
Calcium: 9.7 mg/dL (ref 8.6–10.4)
Chloride: 104 mmol/L (ref 98–110)
Creat: 0.94 mg/dL (ref 0.50–1.05)
GFR, EST AFRICAN AMERICAN: 79 mL/min (ref >=60)
GFR, EST NON AFRICAN AMERICAN: 69 mL/min (ref >=60)
Glucose, Bld: 65 mg/dL (ref 65–99)
Potassium: 4.7 mmol/L (ref 3.5–5.3)
Sodium: 141 mmol/L (ref 135–146)
TOTAL PROTEIN: 6.7 g/dL (ref 6.1–8.1)

## 2016-07-27 MED ORDER — CHLORTHALIDONE 25 MG PO TABS: 25.0000 mg | ORAL_TABLET | Freq: Every day | ORAL | 0 refills | Status: DC

## 2016-07-27 MED ORDER — RANITIDINE HCL 150 MG PO TABS: 150.0000 mg | ORAL_TABLET | Freq: Two times a day (BID) | ORAL | 0 refills | Status: DC

## 2016-07-27 NOTE — Patient Instructions (Addendum)
  Trial of chlorthalidone instead of other BP medications  Trial of Zantac 150mg  - take for 2-4 days with the prilosec and then try to decrease the prilosec to every other day for a week or so and then try to stop  Let me know about the change with the medications which ones work the best and I will get your refills.   IF you received an x-ray today, you will receive an invoice from [facility]. Please contact [facility] at [phone] with questions or concerns regarding your invoice.   IF you received labwork today, you will receive an invoice from [employer]. Please contact [facility] at [phone] with questions or concerns regarding your invoice.   Our billing staff will not be able to assist you with questions regarding bills from these companies.  You will be contacted with the lab results as soon as they are available. The fastest way to get your results is to activate your My Chart account. Instructions are located on the last page of this paperwork. If you have not heard from [place] regarding the results in 2 weeks, please contact this office.

## 2016-07-27 NOTE — Progress Notes (Signed)
Carrie Dillon  MRN: [id number] DOB: [birth year]  Subjective:  Pt presents to clinic for medication refills.  She has decreased her lisinopril to 5mg  and that has helped keep her BP high enough to not feel lightheaded.  She has tried to go without it but then her BP is closer to 140/80 and she does not want it that high.  She would like to go off her Prilosec but every day she misses it she has reflux problems.  She has noticed that her sex drive has decreased as well as sex is becoming more uncomfortable as she is getting older.  Review of Systems  Constitutional: Negative for chills and fever.  Respiratory: Negative for shortness of breath.   Cardiovascular: Negative for chest pain, palpitations and leg swelling.    Patient Active Problem List   Diagnosis Date Noted  . 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 GERD (gastroesophageal reflux disease) 12/13/2012  . Anxiety 12/13/2012  . HTN (hypertension) 12/13/2012    Current Outpatient Prescriptions on File Prior to Visit  Medication Sig Dispense Refill  . diazepam (VALIUM) 5 MG tablet Take 1-2 tablets (5-10 mg total) by mouth at bedtime as needed. 30 tablet 0  . hydrochlorothiazide (HYDRODIURIL) 25 MG tablet Take 1 tablet (25 mg total) by mouth daily. 90 tablet 1  . lisinopril (PRINIVIL,ZESTRIL) 10 MG tablet Take 1 tablet (10 mg total) by mouth daily. 90 tablet 1  . omeprazole (PRILOSEC) 20 MG capsule TAKE ONE CAPSULE BY MOUTH ONCE DAILY 90 capsule 3   No current facility-administered medications on file prior to visit.     Allergies  Allergen Reactions  . Codeine Nausea Only    Pt patients social history was reviewed and updated.  Objective:  BP 122/78   Pulse 75   Temp 97.9 F (36.6 C) (Oral)   Resp 18   Ht 5' 6.5" (1.689 m)   Wt 167 lb (75.8 kg)   SpO2 96%   BMI 26.55 kg/m   Physical Exam  Constitutional: She is oriented to person, place, and time and well-developed, well-nourished, and in no distress.  HENT:  Head:  Normocephalic and atraumatic.  Right Ear: Hearing and external ear normal.  Left Ear: Hearing and external ear normal.  Eyes: Conjunctivae are normal.  Neck: Normal range of motion.  Cardiovascular: Normal rate, regular rhythm and normal heart sounds.   No murmur heard. Pulmonary/Chest: Effort normal and breath sounds normal.  Musculoskeletal:       Right lower leg: She exhibits no edema.       Left lower leg: She exhibits no edema.  Neurological: She is alert and oriented to person, place, and time. Gait normal.  Skin: Skin is warm and dry.  Psychiatric: Mood, memory, affect and judgment normal.  Vitals reviewed.   Assessment and Plan :  Essential hypertension - Plan: COMPLETE METABOLIC PANEL WITH GFR, chlorthalidone (HYGROTON) 25 MG tablet - we will do a trial of Chlorthalidone to see if that will help her swelling and control her BP better - she will trial over the next week and them let me know which helps her the most and I will send in RF.  Gastroesophageal reflux disease without esophagitis - Plan: ranitidine (ZANTAC) 150 MG tablet - we will trial Zantac to see if she can get off the ompeprazole and let me know which helps her  Screening cholesterol level - Plan: Lipid panel  We discussed good lubrication for sex, planned sex so it is  not forgotten and setting the mood -   Windell Hummingbird PA-C  [facility] 07/27/2016 9:09 AM

## 2016-08-08 ENCOUNTER — Encounter: Payer: Self-pay | Admitting: Physician Assistant

## 2016-08-09 MED ORDER — LISINOPRIL 5 MG PO TABS: 5.0000 mg | ORAL_TABLET | Freq: Every day | ORAL | 1 refills | Status: DC

## 2016-08-09 NOTE — Telephone Encounter (Signed)
Added lisinopril 5mg  qd - pt will continue chlorthalidone

## 2016-08-26 ENCOUNTER — Encounter: Payer: Self-pay | Admitting: Physician Assistant

## 2016-08-26 DIAGNOSIS — M7989 Other specified soft tissue disorders: Secondary | ICD-10-CM

## 2016-08-26 DIAGNOSIS — I10 Essential (primary) hypertension: Secondary | ICD-10-CM

## 2016-08-26 DIAGNOSIS — K219 Gastro-esophageal reflux disease without esophagitis: Secondary | ICD-10-CM

## 2016-08-26 MED ORDER — RANITIDINE HCL 150 MG PO TABS
150.0000 mg | ORAL_TABLET | Freq: Two times a day (BID) | ORAL | 3 refills | Status: DC
Start: 2016-08-26 — End: 2017-01-05

## 2016-08-26 MED ORDER — HYDROCHLOROTHIAZIDE 25 MG PO TABS: 25.0000 mg | ORAL_TABLET | Freq: Every day | ORAL | 1 refills | Status: DC

## 2016-08-30 ENCOUNTER — Encounter: Payer: Self-pay | Admitting: Physician Assistant

## 2016-12-30 ENCOUNTER — Emergency Department (HOSPITAL_BASED_OUTPATIENT_CLINIC_OR_DEPARTMENT_OTHER)
Admit: 2016-12-30 | Discharge: 2016-12-30 | Disposition: A | Discharge status: Home / Self Care | Payer: BLUE CROSS/BLUE SHIELD

## 2016-12-30 ENCOUNTER — Encounter (HOSPITAL_COMMUNITY): Payer: Self-pay | Admitting: Emergency Medicine

## 2016-12-30 ENCOUNTER — Emergency Department (HOSPITAL_COMMUNITY): Payer: BLUE CROSS/BLUE SHIELD

## 2016-12-30 ENCOUNTER — Emergency Department (HOSPITAL_COMMUNITY)
Admission: EM | Admit: 2016-12-30 | Discharge: 2016-12-30 | Disposition: A | Discharge status: Home / Self Care | Payer: BLUE CROSS/BLUE SHIELD | Attending: Emergency Medicine | Admitting: Emergency Medicine

## 2016-12-30 DIAGNOSIS — Z87891 Personal history of nicotine dependence: Secondary | ICD-10-CM | POA: Insufficient documentation

## 2016-12-30 DIAGNOSIS — I10 Essential (primary) hypertension: Secondary | ICD-10-CM | POA: Diagnosis not present

## 2016-12-30 DIAGNOSIS — M7989 Other specified soft tissue disorders: Secondary | ICD-10-CM

## 2016-12-30 DIAGNOSIS — J449 Chronic obstructive pulmonary disease, unspecified: Secondary | ICD-10-CM | POA: Insufficient documentation

## 2016-12-30 DIAGNOSIS — M25571 Pain in right ankle and joints of right foot: Secondary | ICD-10-CM | POA: Diagnosis not present

## 2016-12-30 NOTE — ED Notes (Signed)
ED Provider at bedside. 

## 2016-12-30 NOTE — ED Triage Notes (Signed)
Pt reports right ankle and posterior foot swelling x3 days. One spot of redness. Denies hx of blood clots but is concerned about the possibility bc it runs in her family.

## 2016-12-30 NOTE — ED Notes (Signed)
Ortho Tech at bedside.  

## 2016-12-30 NOTE — ED Notes (Addendum)
Pt getting dressed in gown for study.

## 2016-12-30 NOTE — Discharge Instructions (Signed)
Read the information below.  You may return to the Emergency Department at any time for worsening condition or any new symptoms that concern you.  If you develop uncontrolled pain, weakness or numbness of the extremity, severe discoloration of the skin, or you are unable to walk or move your ankle, return to the ER for a recheck.

## 2016-12-30 NOTE — Progress Notes (Signed)
*  PRELIMINARY RESULTS* Vascular Ultrasound Right lower extremity venous duplex has been completed.  Preliminary findings: No evidence of deep vein thrombosis or baker's cyst in the right lower extremity.  Preliminary results given to [facility] @ 12:00   Everrett Coombe 12/30/2016, 12:08 PM

## 2016-12-30 NOTE — ED Provider Notes (Signed)
[facility] DEPT Provider Note   CSN: [id number] Arrival date & time: 12/30/16  1033   By signing my name below, I, Neta Mends, attest that this documentation has been prepared under the direction and in the presence of Preston Garabedian, [place]. Electronically Signed: Neta Mends, ED Scribe. 12/30/2016. 10:56 AM.   History   Chief Complaint Chief Complaint  Patient presents with  . Foot Swelling   The history is provided by the patient. No language interpreter was used.   HPI Comments:  Carrie Dillon is a 56 y.o. female with PMHx of COPD who presents to the Emergency Department complaining of constant right ankle pain and swelling x 3 days. Pt notes one spot of redness. Pt states that the pain is exacerbated with plantarflexion. Pt reports previous hx of similar lower leg swelling. Pt denies any injury/trauma, new shoes, or new activities. Pt reports FHx of DVT and ntoes that her sister recently had a DVT in her leg, her mother and aunt also have blood clot hx. No alleviating factors noted. Pt denies other associated symptoms.  Denies fevers, CP, SOB, weakness or numbness of the foot.    Past Medical History:  Diagnosis Date  . Anxiety   . COPD (chronic obstructive pulmonary disease) ([place]) 2015   former smoker >30y  . GERD (gastroesophageal reflux disease)   . Hypertension     Patient Active Problem List   Diagnosis Date Noted  . 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 GERD (gastroesophageal reflux disease) 12/13/2012  . Anxiety 12/13/2012  . HTN (hypertension) 12/13/2012    Past Surgical History:  Procedure Laterality Date  . ABLATION  6 Years ago  . [place]; 1987   x2  . RECONSTRUCTION OF NOSE  1991  . TUBAL LIGATION  1987    OB History    Gravida Para Term Preterm AB Living   2 2 2     2    SAB TAB Ectopic Multiple Live Births           2       Home Medications    Prior to Admission medications   Medication Sig Start Date End Date  Taking? Authorizing Provider  diazepam (VALIUM) 5 MG tablet Take 1-2 tablets (5-10 mg total) by mouth at bedtime as needed. 06/30/16   Mancel Bale, PA-C  hydrochlorothiazide (HYDRODIURIL) 25 MG tablet Take 1 tablet (25 mg total) by mouth daily. 08/26/16   Mancel Bale, PA-C  lisinopril (PRINIVIL,ZESTRIL) 5 MG tablet Take 1 tablet (5 mg total) by mouth daily. 08/09/16   Mancel Bale, PA-C  omeprazole (PRILOSEC) 20 MG capsule TAKE ONE CAPSULE BY MOUTH ONCE DAILY 01/14/16   Mancel Bale, PA-C  ranitidine (ZANTAC) 150 MG tablet Take 1 tablet (150 mg total) by mouth 2 (two) times daily. 08/26/16   Mancel Bale, PA-C    Family History Family History  Problem Relation Age of Onset  . Anxiety disorder Mother   . Colitis Mother   . Ovarian cancer Mother   . Hypertension Mother   . Skin cancer Father   . Prostate cancer Father   . Breast cancer Sister 37    x3  . Cancer Sister 86    multiple myeloma  . Anxiety disorder Brother   . Anxiety disorder Daughter   . Stroke Maternal Aunt   . Heart disease Paternal Grandmother   . Heart disease Paternal Aunt   . Heart attack Maternal Grandfather   .  Hypertension Maternal Grandfather   . Hypertension Brother     Social History Social History  Substance Use Topics  . Smoking status: Former Smoker    Packs/day: 2.00    Years: 36.00    Quit date: 07/20/2012  . Smokeless tobacco: Never Used  . Alcohol use No     Comment: once every 6 months     Allergies   Codeine   Review of Systems Review of Systems  Constitutional: Negative for fever.  Musculoskeletal: Positive for arthralgias and joint swelling.  Skin: Positive for color change. Negative for pallor and wound.  Allergic/Immunologic: Negative for immunocompromised state.  Neurological: Negative for weakness and numbness.  Hematological: Does not bruise/bleed easily.  Psychiatric/Behavioral: Negative for self-injury.     Physical Exam Updated Vital Signs BP 127/72 (BP Location:  Left Arm)   Pulse 70   Temp 98 F (36.7 C) (Oral)   Resp 16   SpO2 96%   Physical Exam  Constitutional: She appears well-developed and well-nourished. No distress.  HENT:  Head: Normocephalic and atraumatic.  Neck: Neck supple.  Pulmonary/Chest: Effort normal.  Musculoskeletal:  Right anterior medial ankle with edema, tenderness.  No other tenderness or edema throughout right lower extremity including calf which is soft, nontender.  Distal pulses intact. Moves all toes.  Capillary refill < 2 seconds.    Neurological: She is alert.  Skin: She is not diaphoretic.  Nursing note and vitals reviewed.    ED Treatments / Results  DIAGNOSTIC STUDIES:  Oxygen Saturation is 100% on RA, normal by my interpretation.    COORDINATION OF CARE:  10:56 AM Discussed treatment plan with pt at bedside and pt agreed to plan.   Labs (all labs ordered are listed, but only abnormal results are displayed) Labs Reviewed - No data to display  EKG  EKG Interpretation None       Radiology Dg Ankle Complete Right  Result Date: 12/30/2016 CLINICAL DATA:  Right ankle pain and swelling for 3 days EXAM: RIGHT ANKLE - COMPLETE 3+ VIEW COMPARISON:  None. FINDINGS: There is no evidence of fracture, dislocation, or joint effusion. There is no evidence of arthropathy or other focal bone abnormality. Soft tissues are unremarkable. IMPRESSION: Negative. Electronically Signed   By: Kathreen Devoid   On: 12/30/2016 11:39    Procedures Procedures (including critical care time)  Medications Ordered in ED Medications - No data to display   Initial Impression / Assessment and Plan / ED Course  I have reviewed the triage vital signs and the nursing notes.  Pertinent labs & imaging results that were available during my care of the patient were reviewed by me and considered in my medical decision making (see chart for details).  Clinical Course as of Dec 30 1446  Fri Dec 30, 2016  1222 DVT study negative.     [EW]    Clinical Course User Index [EW] Clayton Bibles, PA-C   Afebrile, nontoxic patient with right anterior ankle swelling without injury.  Pt concerned because of her strong family hx blood clots.  Xray negative.  DVT study negative.  ASO placed.    D/C home with ASO, RICE instructions.   Discussed result, findings, treatment, and follow up  with patient.  Pt given return precautions.  Pt verbalizes understanding and agrees with plan.       Final Clinical Impressions(s) / ED Diagnoses   Final diagnoses:  Acute right ankle pain    New Prescriptions Discharge Medication List as of 12/30/2016  12:32 PM     I personally performed the services described in this documentation, which was scribed in my presence. The recorded information has been reviewed and is accurate.    Clayton Bibles, PA-C 12/30/16 [place], MD 12/31/16 1144

## 2017-01-05 ENCOUNTER — Ambulatory Visit (INDEPENDENT_AMBULATORY_CARE_PROVIDER_SITE_OTHER): Payer: BLUE CROSS/BLUE SHIELD | Admitting: Physician Assistant

## 2017-01-05 ENCOUNTER — Ambulatory Visit: Payer: BLUE CROSS/BLUE SHIELD | Admitting: Physician Assistant

## 2017-01-05 ENCOUNTER — Encounter: Payer: Self-pay | Admitting: Physician Assistant

## 2017-01-05 VITALS — BP 122/70 | HR 74 | Temp 98.9°F | Resp 18 | Ht 66.5 in | Wt 169.0 lb

## 2017-01-05 DIAGNOSIS — M79672 Pain in left foot: Secondary | ICD-10-CM

## 2017-01-05 DIAGNOSIS — I10 Essential (primary) hypertension: Secondary | ICD-10-CM

## 2017-01-05 DIAGNOSIS — F419 Anxiety disorder, unspecified: Secondary | ICD-10-CM

## 2017-01-05 DIAGNOSIS — M7989 Other specified soft tissue disorders: Secondary | ICD-10-CM

## 2017-01-05 MED ORDER — DIAZEPAM 5 MG PO TABS: 5.0000 mg | ORAL_TABLET | Freq: Every evening | ORAL | 0 refills | Status: DC | PRN

## 2017-01-05 MED ORDER — HYDROCHLOROTHIAZIDE 25 MG PO TABS: 25.0000 mg | ORAL_TABLET | Freq: Every day | ORAL | 1 refills | Status: DC

## 2017-01-05 MED ORDER — LISINOPRIL 5 MG PO TABS: 5.0000 mg | ORAL_TABLET | Freq: Every day | ORAL | 1 refills | Status: DC

## 2017-01-05 NOTE — Progress Notes (Signed)
Carrie Dillon  MRN: [id number] DOB: 12-19-1961  Subjective:  Pt presents to clinic with foot swelling and pain.  It started about a week ago and today seems fine but was swollen and painful yesterday when she made the appt.  She got nervous and went to the ED 4 days ago and at the time had a neg xray of her ankle and neg doppler because she was worried about a blood clot.  She did not have an injury that she knows of.  She has tried to stay off of it but it hurt no matter what.  She has never had gout before.  The pain was mainly associated with her 3/4 MTPs and radiated proximal to that area.  The areas was also mild red but that has also resolved.    Also about time for her HTN check.  She has been getting a few episode of dizziness but it is not associated with change in upright position mainly seems to happen when she turns her head quickly. She tried to decrease her diuretic but then her swelling got worse - she has gone without her lisinopril but her BP seems to get in the 130/80s with that.  Review of Systems  Constitutional: Negative for chills and fever.  Respiratory: Negative for shortness of breath.   Cardiovascular: Negative for chest pain, palpitations and leg swelling.  Musculoskeletal: Negative for gait problem.  Neurological: Negative for headaches.    Patient Active Problem List   Diagnosis Date Noted  . 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 GERD (gastroesophageal reflux disease) 12/13/2012  . Anxiety 12/13/2012  . HTN (hypertension) 12/13/2012    Current Outpatient Prescriptions on File Prior to Visit  Medication Sig Dispense Refill  . omeprazole (PRILOSEC) 20 MG capsule TAKE ONE CAPSULE BY MOUTH ONCE DAILY 90 capsule 3   No current facility-administered medications on file prior to visit.     Allergies  Allergen Reactions  . Codeine Nausea Only    Pt patients past, family and social history were reviewed and updated.   Objective:  BP 122/70 (BP Location:  Right Arm, Patient Position: Sitting, Cuff Size: Small)   Pulse 74   Temp 98.9 F (37.2 C) (Oral)   Resp 18   Ht 5' 6.5" (1.689 m)   Wt 169 lb (76.7 kg)   SpO2 98%   BMI 26.87 kg/m   Physical Exam  Constitutional: She is oriented to person, place, and time and well-developed, well-nourished, and in no distress.  HENT:  Head: Normocephalic and atraumatic.  Right Ear: Hearing and external ear normal.  Left Ear: Hearing and external ear normal.  Eyes: Conjunctivae are normal.  Neck: Normal range of motion.  Cardiovascular: Normal rate, regular rhythm and normal heart sounds.   No murmur heard. Pulmonary/Chest: Effort normal and breath sounds normal.  Musculoskeletal:       Right lower leg: She exhibits no edema.       Left lower leg: She exhibits no edema.       Right foot: There is tenderness (mild 3/4th MTP). There is normal range of motion and no swelling (no erythema).  Neurological: She is alert and oriented to person, place, and time. Gait normal.  Skin: Skin is warm and dry.  Psychiatric: Mood, memory, affect and judgment normal.  Vitals reviewed.   Assessment and Plan :  Essential hypertension - Plan: lisinopril (PRINIVIL,ZESTRIL) 5 MG tablet, hydrochlorothiazide (HYDRODIURIL) 25 MG tablet - well controlled - she will monitor  and if it seems that it is getting to low she will break the lisinopril in half   Bilateral swelling of feet - Plan: hydrochlorothiazide (HYDRODIURIL) 25 MG tablet  Anxiety - Plan: diazepam (VALIUM) 5 MG tablet - refilled - pt rarely uses  Foot pain - ? Gout - pt is better now she will contact me if the pain swelling starts again  Windell Hummingbird PA-C  [facility] 01/05/2017 5:29 PM

## 2017-01-05 NOTE — Patient Instructions (Signed)
     IF you received an x-ray today, you will receive an invoice from [facility]. Please contact [facility] at [phone] with questions or concerns regarding your invoice.   IF you received labwork today, you will receive an invoice from [facility]. Please contact [facility] at [phone] with questions or concerns regarding your invoice.   Our billing staff will not be able to assist you with questions regarding bills from these companies.  You will be contacted with the lab results as soon as they are available. The fastest way to get your results is to activate your My Chart account. Instructions are located on the last page of this paperwork. If you have not heard from us regarding the results in 2 weeks, please contact this office.     

## 2017-02-07 ENCOUNTER — Other Ambulatory Visit: Payer: Self-pay | Admitting: Physician Assistant

## 2017-02-07 DIAGNOSIS — F419 Anxiety disorder, unspecified: Secondary | ICD-10-CM

## 2017-02-09 ENCOUNTER — Other Ambulatory Visit: Payer: Self-pay | Admitting: Physician Assistant

## 2017-02-09 DIAGNOSIS — F419 Anxiety disorder, unspecified: Secondary | ICD-10-CM

## 2017-02-09 NOTE — Telephone Encounter (Signed)
Called to [facility] 

## 2017-02-09 NOTE — Telephone Encounter (Signed)
done

## 2017-02-10 ENCOUNTER — Other Ambulatory Visit: Payer: Self-pay

## 2017-02-18 ENCOUNTER — Ambulatory Visit (INDEPENDENT_AMBULATORY_CARE_PROVIDER_SITE_OTHER): Payer: BLUE CROSS/BLUE SHIELD | Admitting: Physician Assistant

## 2017-02-18 VITALS — BP 106/80 | HR 79 | Temp 98.4°F | Resp 16 | Ht 67.0 in | Wt 173.0 lb

## 2017-02-18 DIAGNOSIS — J22 Unspecified acute lower respiratory infection: Secondary | ICD-10-CM | POA: Diagnosis not present

## 2017-02-18 MED ORDER — AZITHROMYCIN 250 MG PO TABS: ORAL_TABLET | ORAL | 0 refills | Status: DC

## 2017-02-18 MED ORDER — PREDNISONE 20 MG PO TABS
ORAL_TABLET | ORAL | 0 refills | Status: DC
Start: 2017-02-18 — End: 2017-09-29

## 2017-02-18 NOTE — Patient Instructions (Addendum)
    Acute Bronchitis, Adult Acute bronchitis is when air tubes (bronchi) in the lungs suddenly get swollen. The condition can make it hard to breathe. It can also cause these symptoms:  A cough.  Coughing up clear, yellow, or green mucus.  Wheezing.  Chest congestion.  Shortness of breath.  A fever.  Body aches.  Chills.  A sore throat. Follow these instructions at home: Medicines   Take over-the-counter and prescription medicines only as told by your doctor.  If you were prescribed an antibiotic medicine, take it as told by your doctor. Do not stop taking the antibiotic even if you start to feel better. General instructions   Rest.  Drink enough fluids to keep your pee (urine) clear or pale yellow.  Avoid smoking and secondhand smoke. If you smoke and you need help quitting, ask your doctor. Quitting will help your lungs heal faster.  Use an inhaler, cool mist vaporizer, or humidifier as told by your doctor.  Keep all follow-up visits as told by your doctor. This is important. How is this prevented? To lower your risk of getting this condition again:  Wash your hands often with soap and water. If you cannot use soap and water, use hand sanitizer.  Avoid contact with people who have cold symptoms.  Try not to touch your hands to your mouth, nose, or eyes.  Make sure to get the flu shot every year. Contact a doctor if:  Your symptoms do not get better in 2 weeks. Get help right away if:  You cough up blood.  You have chest pain.  You have very bad shortness of breath.  You become dehydrated.  You faint (pass out) or keep feeling like you are going to pass out.  You keep throwing up (vomiting).  You have a very bad headache.  Your fever or chills gets worse. This information is not intended to replace advice given to you by your health care provider. Make sure you discuss any questions you have with your health care provider. Document Released:  05/30/2008 Document Revised: 07/20/2016 Document Reviewed: 06/01/2016 Elsevier Interactive Patient Education  2017 [employer]   IF you received an x-ray today, you will receive an invoice from [facility]. Please contact [facility] at [phone] with questions or concerns regarding your invoice.   IF you received labwork today, you will receive an invoice from [facility]. Please contact [facility] at [phone] with questions or concerns regarding your invoice.   Our billing staff will not be able to assist you with questions regarding bills from these companies.  You will be contacted with the lab results as soon as they are available. The fastest way to get your results is to activate your My Chart account. Instructions are located on the last page of this paperwork. If you have not heard from us regarding the results in 2 weeks, please contact this office.      

## 2017-02-18 NOTE — Progress Notes (Signed)
[facility] [address] [place] [address] [phone]  Date:  02/18/2017   Name:  Carrie Dillon   DOB:  [birth year]   MRN:  [id number]  PCP:  Elizabeth Sauer   Chief Complaint  Patient presents with  . Shortness of Breath    x 3 weeks  . Cough  . Nasal Congestion    History of Present Illness:  ELSI STELZER is a 56 y.o. female patient who presents to [facility] for cc of easy fatigue, cough, and nasal congestion for 3 weeks.    3 weeks ago, with scratchy sore throat.  She developed head stuffiness.  1 week she developed a dry cough.  Chest felt tight.  She can force a productive green sputum but it generally is a dry cough.  She has green sputum.  She was having windedness with doing easy work such as bringing her plants outside, but denies sob.   She has some ear pain.  Sinus pain and pressure behind the left eye which has improved greatly.   She has been taking Nyquil at night.  She is taking extreme sinus and flu medication which has helped some. She was dx with copd, however was rechecked and found to be normal.    Patient Active Problem List   Diagnosis Date Noted  . 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 GERD (gastroesophageal reflux disease) 12/13/2012  . Anxiety 12/13/2012  . HTN (hypertension) 12/13/2012    Past Medical History:  Diagnosis Date  . Anxiety   . COPD (chronic obstructive pulmonary disease) ([place]) 2015   former smoker >30y  . GERD (gastroesophageal reflux disease)   . Hypertension     Past Surgical History:  Procedure Laterality Date  . ABLATION  6 Years ago  . [place]; 1987   x2  . RECONSTRUCTION OF NOSE  1991  . TUBAL LIGATION  1987    Social History  Substance Use Topics  . Smoking status: Former Smoker    Packs/day: 2.00    Years: 36.00    Quit date: 07/20/2012  . Smokeless tobacco: Never Used  . Alcohol use No     Comment: once every 6 months    Family History  Problem Relation Age of Onset   . Anxiety disorder Mother   . Colitis Mother   . Ovarian cancer Mother   . Hypertension Mother   . Skin cancer Father   . Prostate cancer Father   . Breast cancer Sister 37    x3  . Cancer Sister 3    multiple myeloma  . Anxiety disorder Brother   . Anxiety disorder Daughter   . Stroke Maternal Aunt   . Heart disease Paternal Grandmother   . Heart disease Paternal Aunt   . Heart attack Maternal Grandfather   . Hypertension Maternal Grandfather   . Hypertension Brother     Allergies  Allergen Reactions  . Codeine Nausea Only    Medication list has been reviewed and updated.  Current Outpatient Prescriptions on File Prior to Visit  Medication Sig Dispense Refill  . diazepam (VALIUM) 5 MG tablet TAKE ONE TO TWO TABLETS BY MOUTH AT BEDTIME AS NEEDED 30 tablet 0  . hydrochlorothiazide (HYDRODIURIL) 25 MG tablet Take 1 tablet (25 mg total) by mouth daily. 90 tablet 1  . lisinopril (PRINIVIL,ZESTRIL) 5 MG tablet Take 1 tablet (5 mg total) by mouth daily. 90 tablet 1  . omeprazole (PRILOSEC) 20 MG  capsule TAKE ONE CAPSULE BY MOUTH ONCE DAILY 90 capsule 3   No current facility-administered medications on file prior to visit.     ROS ROS otherwise unremarkable unless listed above.   Physical Examination: BP 106/80   Pulse 79   Temp 98.4 F (36.9 C)   Resp 16   Ht 5' 7"  (1.702 m)   Wt 173 lb (78.5 kg)   SpO2 96%   BMI 27.10 kg/m  Ideal Body Weight: Weight in (lb) to have BMI = 25: 159.3  Physical Exam  Constitutional: She is oriented to person, place, and time. She appears well-developed and well-nourished. No distress.  HENT:  Head: Normocephalic and atraumatic.  Right Ear: Tympanic membrane, external ear and ear canal normal.  Left Ear: Tympanic membrane, external ear and ear canal normal.  Nose: Right sinus exhibits no maxillary sinus tenderness and no frontal sinus tenderness. Left sinus exhibits no maxillary sinus tenderness and no frontal sinus tenderness.   Mouth/Throat: Oropharynx is clear and moist. No uvula swelling. No oropharyngeal exudate, posterior oropharyngeal edema or posterior oropharyngeal erythema.  Eyes: Conjunctivae and EOM are normal. Pupils are equal, round, and reactive to light.  Neck: Normal range of motion. Neck supple. No thyromegaly present.  Cardiovascular: Normal rate, regular rhythm, normal heart sounds and intact distal pulses.  Exam reveals no gallop, no distant heart sounds and no friction rub.   No murmur heard. Pulmonary/Chest: Effort normal. No respiratory distress. She has no decreased breath sounds. She has no wheezes. She has rhonchi (mild).  Abdominal: Soft. Bowel sounds are normal. She exhibits no distension and no mass. There is no tenderness.  Musculoskeletal: Normal range of motion. She exhibits no edema or tenderness.  Lymphadenopathy:       Head (right side): No submandibular, no tonsillar, no preauricular and no posterior auricular adenopathy present.       Head (left side): No submandibular, no tonsillar, no preauricular and no posterior auricular adenopathy present.    She has no cervical adenopathy.  Neurological: She is alert and oriented to person, place, and time. No cranial nerve deficit. She exhibits normal muscle tone. Coordination normal.  Skin: Skin is warm and dry. She is not diaphoretic.  Psychiatric: She has a normal mood and affect. Her behavior is normal.     Assessment and Plan: RYLA CAUTHON is a 57 y.o. female who is here today for cc of easy fatigue, congestion, and chest tightness.  Given longevity of symptoms, will treat for atypical lower respiratory infection.  Short dose of prednisone. Alarming sxs to warrant rtc advised.   rtc as needed.  Lower respiratory infection (e.g., bronchitis, pneumonia, pneumonitis, pulmonitis) - Plan: azithromycin (ZITHROMAX) 250 MG tablet, predniSONE (DELTASONE) 20 MG tablet  Ivar Drape, PA-C [facility] 2/26/20187:38 AM

## 2017-03-15 ENCOUNTER — Encounter: Payer: Self-pay | Admitting: Physician Assistant

## 2017-08-23 ENCOUNTER — Other Ambulatory Visit: Payer: Self-pay | Admitting: Physician Assistant

## 2017-08-23 DIAGNOSIS — F419 Anxiety disorder, unspecified: Secondary | ICD-10-CM

## 2017-08-24 NOTE — Telephone Encounter (Signed)
Please advise 

## 2017-08-24 NOTE — Telephone Encounter (Signed)
Done

## 2017-09-16 ENCOUNTER — Other Ambulatory Visit: Payer: Self-pay | Admitting: Physician Assistant

## 2017-09-16 DIAGNOSIS — M7989 Other specified soft tissue disorders: Secondary | ICD-10-CM

## 2017-09-16 DIAGNOSIS — I10 Essential (primary) hypertension: Secondary | ICD-10-CM

## 2017-09-18 NOTE — Telephone Encounter (Signed)
mychart message sent to pt about making an apt for more refills °

## 2017-09-22 ENCOUNTER — Ambulatory Visit: Payer: BLUE CROSS/BLUE SHIELD | Admitting: Physician Assistant

## 2017-09-29 ENCOUNTER — Ambulatory Visit (INDEPENDENT_AMBULATORY_CARE_PROVIDER_SITE_OTHER): Payer: BLUE CROSS/BLUE SHIELD | Admitting: Physician Assistant

## 2017-09-29 ENCOUNTER — Encounter: Payer: Self-pay | Admitting: Physician Assistant

## 2017-09-29 VITALS — BP 118/80 | HR 82 | Temp 98.6°F | Resp 18 | Ht 67.0 in | Wt 180.2 lb

## 2017-09-29 DIAGNOSIS — M7989 Other specified soft tissue disorders: Secondary | ICD-10-CM | POA: Diagnosis not present

## 2017-09-29 DIAGNOSIS — K219 Gastro-esophageal reflux disease without esophagitis: Secondary | ICD-10-CM

## 2017-09-29 DIAGNOSIS — I10 Essential (primary) hypertension: Secondary | ICD-10-CM

## 2017-09-29 DIAGNOSIS — Z13220 Encounter for screening for lipoid disorders: Secondary | ICD-10-CM

## 2017-09-29 DIAGNOSIS — F419 Anxiety disorder, unspecified: Secondary | ICD-10-CM | POA: Diagnosis not present

## 2017-09-29 MED ORDER — HYDROCHLOROTHIAZIDE 25 MG PO TABS: 25.0000 mg | ORAL_TABLET | Freq: Every day | ORAL | 4 refills | Status: AC

## 2017-09-29 MED ORDER — DIAZEPAM 5 MG PO TABS: 5.0000 mg | ORAL_TABLET | Freq: Every evening | ORAL | 0 refills | Status: AC | PRN

## 2017-09-29 MED ORDER — OMEPRAZOLE 20 MG PO CPDR: DELAYED_RELEASE_CAPSULE | ORAL | 3 refills | Status: DC

## 2017-09-29 NOTE — Progress Notes (Signed)
Carrie Dillon  MRN: [id number] DOB: [birth year]  PCP: Mancel Bale, PA-C  Chief Complaint  Patient presents with  . Medication Refill    hydrodiuril, prilosec, and needs to discuss lisinopril     Subjective:  Pt presents to clinic for medication refill and discussion of leg swelling and possible medication changes.  She has had left foot/ankle swelling for years and has been told she has peripheral vascular disease but not varicose veins.  She was encouraged to wear compression socks/thigh highs but has not been doing that.  For the last several months she has been having right foot pain and swelling.  She finds that the swelling is worse with walking but it does resolve when foot is elevated.  She has had an MRI which was normal so now she is confused about the swelling as she thought it was related to an injury to her foot.  She has no SOB and sleeps flat at night and the swelling is down in the am.  Not exercising due to foot swelling of the right side - she has had increased weight  History is obtained by patient.  Review of Systems  Constitutional: Negative for chills and fever.  Eyes: Negative for visual disturbance.  Respiratory: Negative for shortness of breath.   Cardiovascular: Positive for leg swelling (bilateral feet swelling - ). Negative for chest pain and palpitations.  Neurological: Negative for dizziness, light-headedness and headaches.    Patient Active Problem List   Diagnosis Date Noted  . 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 GERD (gastroesophageal reflux disease) 12/13/2012  . Anxiety 12/13/2012  . HTN (hypertension) 12/13/2012    Current Outpatient Prescriptions on File Prior to Visit  Medication Sig Dispense Refill  . lisinopril (PRINIVIL,ZESTRIL) 5 MG tablet Take 1 tablet (5 mg total) by mouth daily. 90 tablet 1   No current facility-administered medications on file prior to visit.     Allergies  Allergen Reactions  . Codeine Nausea Only    Past  Medical History:  Diagnosis Date  . Anxiety   . COPD (chronic obstructive pulmonary disease) ([place]) 2015   former smoker >30y  . GERD (gastroesophageal reflux disease)   . Hypertension    Social History   Social History Narrative   Lives with her husband.    Her adult children live in [place], [place].   Social History  Substance Use Topics  . Smoking status: Former Smoker    Packs/day: 2.00    Years: 36.00    Quit date: 07/20/2012  . Smokeless tobacco: Never Used  . Alcohol use No     Comment: once every 6 months   family history includes Anxiety disorder in her brother, daughter, and mother; Breast cancer (age of onset: 18) in her sister; Cancer (age of onset: 74) in her sister; Colitis in her mother; Heart attack in her maternal grandfather; Heart disease in her paternal aunt and paternal grandmother; Hypertension in her brother, maternal grandfather, and mother; Ovarian cancer in her mother; Prostate cancer in her father; Skin cancer in her father; Stroke in her maternal aunt.     Objective:  BP 118/80   Pulse 82   Temp 98.6 F (37 C) (Oral)   Resp 18   Ht _0  (1.702 m)   Wt 180 lb 3.2 oz (81.7 kg)   SpO2 95%   BMI 28.22 kg/m  Body mass index is 28.22 kg/m.  Physical Exam  Constitutional: She is oriented to person, place,  and time and well-developed, well-nourished, and in no distress.  HENT:  Head: Normocephalic and atraumatic.  Right Ear: Hearing and external ear normal.  Left Ear: Hearing and external ear normal.  Eyes: Conjunctivae are normal.  Neck: Normal range of motion.  Cardiovascular: Normal rate, regular rhythm and normal heart sounds.   No murmur heard. Pulmonary/Chest: Effort normal and breath sounds normal.  Musculoskeletal:       Right lower leg: She exhibits edema (edema in foot with mild pitting edema 1/3 up shin).       Left lower leg: She exhibits edema (mild edema foot does not extend past ankle).  No varicose veins seen  Neurological:  She is alert and oriented to person, place, and time. Gait normal.  Skin: Skin is warm and dry.  Psychiatric: Mood, memory, affect and judgment normal.  Vitals reviewed.    Wt Readings from Last 3 Encounters:  09/29/17 180 lb 3.2 oz (81.7 kg)  02/18/17 173 lb (78.5 kg)  01/05/17 169 lb (76.7 kg)   Wt Readings from Last 3 Encounters:  09/29/17 180 lb 3.2 oz (81.7 kg)  02/18/17 173 lb (78.5 kg)  01/05/17 169 lb (76.7 kg)     Assessment and Plan :  Bilateral swelling of feet - Plan: CMP14+EGFR, hydrochlorothiazide (HYDRODIURIL) 25 MG tablet - I suspect this is peripheral vascular diseae - she has had [place] in the past and was encouraged to wear compression socks but has not - she was again encouraged to try these as I suspect that her swelling will improve with the use  Anxiety - Plan: diazepam (VALIUM) 5 MG tablet - continue prn use  Gastroesophageal reflux disease without esophagitis - Plan: omeprazole (PRILOSEC) 20 MG capsule, DISCONTINUED: omeprazole (PRILOSEC) 20 MG capsule - refilled medication - good while on this medication but if she stops her symptoms return  Screening, lipid - Plan: Lipid panel  Essential hypertension - Plan: hydrochlorothiazide (HYDRODIURIL) 25 MG tablet - currently BP is controlled even with her weight gain.  We talked about getting back and exercising to help with weight.  Windell Hummingbird PA-C  [facility] 09/30/2017 1:20 PM

## 2017-09-29 NOTE — Patient Instructions (Addendum)
     IF you received an x-ray today, you will receive an invoice from [facility]. Please contact [facility] at [phone] with questions or concerns regarding your invoice.   IF you received labwork today, you will receive an invoice from [facility]. Please contact [facility] at [phone] with questions or concerns regarding your invoice.   Our billing staff will not be able to assist you with questions regarding bills from these companies.  You will be contacted with the lab results as soon as they are available. The fastest way to get your results is to activate your My Chart account. Instructions are located on the last page of this paperwork. If you have not heard from [place] regarding the results in 2 weeks, please contact this office.    CEP performance compression socks

## 2017-09-30 LAB — LIPID PANEL
Chol/HDL Ratio: 3.5 ratio (ref 0.0–4.4)
Cholesterol, Total: 209 mg/dL — ABNORMAL HIGH (ref 100–199)
HDL: 60 mg/dL (ref >39)
LDL Calculated: 130 mg/dL — ABNORMAL HIGH (ref 0–99)
Triglycerides: 95 mg/dL (ref 0–149)
VLDL Cholesterol Cal: 19 mg/dL (ref 5–40)

## 2017-09-30 LAB — CMP14+EGFR
A/G RATIO: 2 (ref 1.2–2.2)
ALBUMIN: 4.6 g/dL (ref 3.5–5.5)
ALT: 14 IU/L (ref 0–32)
AST: 20 IU/L (ref 0–40)
Alkaline Phosphatase: 87 IU/L (ref 39–117)
BUN / CREAT RATIO: 19 (ref 9–23)
BUN: 15 mg/dL (ref 6–24)
Bilirubin Total: 0.4 mg/dL (ref 0.0–1.2)
CALCIUM: 9.7 mg/dL (ref 8.7–10.2)
CO2: 24 mmol/L (ref 20–29)
Chloride: 102 mmol/L (ref 96–106)
Creatinine, Ser: 0.81 mg/dL (ref 0.57–1.00)
GFR, EST AFRICAN AMERICAN: 94 mL/min/{1.73_m2} (ref >59)
GFR, EST NON AFRICAN AMERICAN: 81 mL/min/{1.73_m2} (ref >59)
GLOBULIN, TOTAL: 2.3 g/dL (ref 1.5–4.5)
Glucose: 87 mg/dL (ref 65–99)
POTASSIUM: 4.1 mmol/L (ref 3.5–5.2)
SODIUM: 141 mmol/L (ref 134–144)
TOTAL PROTEIN: 6.9 g/dL (ref 6.0–8.5)

## 2018-03-20 ENCOUNTER — Ambulatory Visit: Payer: BLUE CROSS/BLUE SHIELD | Admitting: Physician Assistant

## 2018-09-24 ENCOUNTER — Ambulatory Visit: Payer: BLUE CROSS/BLUE SHIELD | Admitting: Obstetrics & Gynecology

## 2019-01-09 ENCOUNTER — Ambulatory Visit: Payer: BLUE CROSS/BLUE SHIELD | Admitting: Obstetrics & Gynecology

## 2019-02-04 ENCOUNTER — Ambulatory Visit: Payer: BLUE CROSS/BLUE SHIELD | Admitting: Obstetrics & Gynecology

## 2019-04-09 ENCOUNTER — Other Ambulatory Visit: Payer: Self-pay

## 2019-04-11 ENCOUNTER — Encounter: Payer: Self-pay | Admitting: Obstetrics & Gynecology

## 2019-04-11 ENCOUNTER — Ambulatory Visit (INDEPENDENT_AMBULATORY_CARE_PROVIDER_SITE_OTHER): Payer: BLUE CROSS/BLUE SHIELD | Admitting: Obstetrics & Gynecology

## 2019-04-11 ENCOUNTER — Other Ambulatory Visit: Payer: Self-pay

## 2019-04-11 VITALS — BP 120/70 | Ht 66.0 in | Wt 169.0 lb

## 2019-04-11 DIAGNOSIS — Z01419 Encounter for gynecological examination (general) (routine) without abnormal findings: Secondary | ICD-10-CM | POA: Diagnosis not present

## 2019-04-11 DIAGNOSIS — E663 Overweight: Secondary | ICD-10-CM

## 2019-04-11 DIAGNOSIS — Z1151 Encounter for screening for human papillomavirus (HPV): Secondary | ICD-10-CM

## 2019-04-11 DIAGNOSIS — Z8041 Family history of malignant neoplasm of ovary: Secondary | ICD-10-CM

## 2019-04-11 DIAGNOSIS — Z780 Asymptomatic menopausal state: Secondary | ICD-10-CM

## 2019-04-11 DIAGNOSIS — R1032 Left lower quadrant pain: Secondary | ICD-10-CM

## 2019-04-11 NOTE — Progress Notes (Signed)
Carrie Dillon Oct 31, 1961 [id number]   History:    58 y.o. G2P2L2 Married.  Has 2 grand-children.  RP:  New patient presenting for annual gyn exam   HPI: Menopause, well on no HRT.  No PMB.  Having intermittent discomfort in the lower left abdomen.  Not complaining of bloatedness or problems with stools.  Having a normal bowel movement every day.  No urinary tract infection symptoms.  No pain with intercourse.  No abnormal vaginal discharge.  Breast normal.  Body mass index 27.28.  Walking regularly.  Health labs with family physician.  Past medical history,surgical history, family history and social history were all reviewed and documented in the EPIC chart.  Gynecologic History No LMP recorded. Patient has had an ablation. Contraception: post menopausal status Last Pap: Per patient >10 yrs ago Last mammogram: Per patient >10 yrs ago Bone Density: Never Colonoscopy: Never.  Cologard negative 11/2018.  Obstetric History OB History  Gravida Para Term Preterm AB Living  2 2 2     2   SAB TAB Ectopic Multiple Live Births          2    # Outcome Date GA Lbr Len/2nd Weight Sex Delivery Anes PTL Lv  2 Term 1987 [redacted]w[redacted]d  6 lb 5 oz (2.863 kg) F CS-Unspec   LIV  1 Term 1985 [redacted]w[redacted]d  7 lb 4 oz (3.289 kg) F CS-Unspec   LIV     ROS: A ROS was performed and pertinent positives and negatives are included in the history.  GENERAL: No fevers or chills. HEENT: No change in vision, no earache, sore throat or sinus congestion. NECK: No pain or stiffness. CARDIOVASCULAR: No chest pain or pressure. No palpitations. PULMONARY: No shortness of breath, cough or wheeze. GASTROINTESTINAL: No abdominal pain, nausea, vomiting or diarrhea, melena or bright red blood per rectum. GENITOURINARY: No urinary frequency, urgency, hesitancy or dysuria. MUSCULOSKELETAL: No joint or muscle pain, no back pain, no recent trauma. DERMATOLOGIC: No rash, no itching, no lesions. ENDOCRINE: No polyuria, polydipsia, no heat or  cold intolerance. No recent change in weight. HEMATOLOGICAL: No anemia or easy bruising or bleeding. NEUROLOGIC: No headache, seizures, numbness, tingling or weakness. PSYCHIATRIC: No depression, no loss of interest in normal activity or change in sleep pattern.     Exam:   BP 120/70   Ht 5\' 6"  (1.676 m)   Wt 169 lb (76.7 kg)   BMI 27.28 kg/m   Body mass index is 27.28 kg/m.  General appearance : Well developed well nourished female. No acute distress HEENT: Eyes: no retinal hemorrhage or exudates,  Neck supple, trachea midline, no carotid bruits, no thyroidmegaly Lungs: Clear to auscultation, no rhonchi or wheezes, or rib retractions  Heart: Regular rate and rhythm, no murmurs or gallops Breast:Examined in sitting and supine position were symmetrical in appearance, no palpable masses or tenderness,  no skin retraction, no nipple inversion, no nipple discharge, no skin discoloration, no axillary or supraclavicular lymphadenopathy Abdomen: no palpable masses or tenderness, no rebound or guarding Extremities: no edema or skin discoloration or tenderness  Pelvic: Vulva: Normal             Vagina: No gross lesions or discharge  Cervix: No gross lesions or discharge.  Pap/HPV HR done.  Uterus  Midposition, normal size, shape and consistency, non-tender and mobile  Adnexa  Without masses or tenderness  Anus: Normal   Assessment/Plan:  58 y.o. female for annual exam   1. Encounter for routine gynecological  examination with Papanicolaou smear of cervix Normal gynecologic exam in menopause.  Pap test with high-risk HPV done today.  Breast exam normal.  Will schedule a screening mammogram now.  Recommend a colonoscopy.  Cologard negative in December 2019, will organize a colonoscopy next year.  Health labs with family physician.  2. Postmenopause Well on no hormone replacement therapy.  No postmenopausal bleeding.  Recommend vitamin D supplements, calcium intake of 1200 to 1500 mg daily  including nutritional and supplemental, regular weightbearing physical activities.  3. Left lower quadrant abdominal pain Normal gynecologic exam, but complains of intermittent left lower quadrant abdominal pain.  In the context of her mother dying of ovarian cancer in her 28s, decision to proceed with a pelvic ultrasound as soon as possible. - US Transvaginal Non-OB; Future  4. Family history of ovarian cancer Mother deceased of ovarian cancer in her 70s. - US Transvaginal Non-OB; Future  5. Overweight (BMI 25.0-29.9) Recommend a lower calorie/carb diet such as [employer].  Increase physical activity with aerobic physical activities 5 times a week and weightlifting every 2 days.  Princess Bruins MD, 8:21 AM 04/11/2019

## 2019-04-11 NOTE — Addendum Note (Signed)
Addended by: Thurnell Garbe A on: 04/11/2019 10:21 AM   Modules accepted: Orders

## 2019-04-11 NOTE — Patient Instructions (Signed)
1. Encounter for routine gynecological examination with Papanicolaou smear of cervix Normal gynecologic exam in menopause.  Pap test with high-risk HPV done today.  Breast exam normal.  Will schedule a screening mammogram now.  Recommend a colonoscopy.  Cologard negative in December 2019, will organize a colonoscopy next year.  Health labs with family physician.  2. Postmenopause Well on no hormone replacement therapy.  No postmenopausal bleeding.  Recommend vitamin D supplements, calcium intake of 1200 to 1500 mg daily including nutritional and supplemental, regular weightbearing physical activities.  3. Left lower quadrant abdominal pain Normal gynecologic exam, but complains of intermittent left lower quadrant abdominal pain.  In the context of her mother dying of ovarian cancer in her 72s, decision to proceed with a pelvic ultrasound as soon as possible. - US Transvaginal Non-OB; Future  4. Family history of ovarian cancer Mother deceased of ovarian cancer in her 12s. - US Transvaginal Non-OB; Future  5. Overweight (BMI 25.0-29.9) Recommend a lower calorie/carb diet such as [employer].  Increase physical activity with aerobic physical activities 5 times a week and weightlifting every 2 days.  Carrie Dillon, it was a pleasure meeting you today!  I will inform you of your results as soon as they are available.

## 2019-04-13 LAB — PAP, TP IMAGING W/ HPV RNA, RFLX HPV TYPE 16,18/45: HPV DNA High Risk: NOT DETECTED

## 2019-04-15 ENCOUNTER — Encounter: Payer: Self-pay | Admitting: *Deleted

## 2019-07-03 ENCOUNTER — Ambulatory Visit: Payer: BLUE CROSS/BLUE SHIELD | Admitting: Obstetrics & Gynecology

## 2019-07-03 ENCOUNTER — Other Ambulatory Visit: Payer: BLUE CROSS/BLUE SHIELD

## 2019-08-08 ENCOUNTER — Ambulatory Visit: Payer: BLUE CROSS/BLUE SHIELD | Admitting: Obstetrics & Gynecology

## 2019-08-08 ENCOUNTER — Other Ambulatory Visit: Payer: BLUE CROSS/BLUE SHIELD

## 2020-07-31 ENCOUNTER — Ambulatory Visit: Payer: BC Managed Care – PPO | Admitting: Podiatry

## 2021-06-16 ENCOUNTER — Ambulatory Visit: Payer: BC Managed Care – PPO | Admitting: Obstetrics & Gynecology

## 2021-07-22 ENCOUNTER — Ambulatory Visit: Payer: BC Managed Care – PPO | Admitting: Obstetrics & Gynecology

## 2021-09-15 ENCOUNTER — Emergency Department (HOSPITAL_COMMUNITY)
Admission: EM | Admit: 2021-09-15 | Discharge: 2021-09-15 | Disposition: A | Discharge status: Home / Self Care | Payer: BC Managed Care – PPO | Attending: Emergency Medicine | Admitting: Emergency Medicine

## 2021-09-15 ENCOUNTER — Emergency Department (HOSPITAL_COMMUNITY): Payer: BC Managed Care – PPO

## 2021-09-15 DIAGNOSIS — M25559 Pain in unspecified hip: Secondary | ICD-10-CM | POA: Insufficient documentation

## 2021-09-15 DIAGNOSIS — I10 Essential (primary) hypertension: Secondary | ICD-10-CM | POA: Insufficient documentation

## 2021-09-15 DIAGNOSIS — Z79899 Other long term (current) drug therapy: Secondary | ICD-10-CM | POA: Insufficient documentation

## 2021-09-15 DIAGNOSIS — R0789 Other chest pain: Secondary | ICD-10-CM

## 2021-09-15 DIAGNOSIS — R0602 Shortness of breath: Secondary | ICD-10-CM | POA: Diagnosis not present

## 2021-09-15 DIAGNOSIS — Z87891 Personal history of nicotine dependence: Secondary | ICD-10-CM | POA: Insufficient documentation

## 2021-09-15 DIAGNOSIS — J449 Chronic obstructive pulmonary disease, unspecified: Secondary | ICD-10-CM | POA: Insufficient documentation

## 2021-09-15 DIAGNOSIS — M25519 Pain in unspecified shoulder: Secondary | ICD-10-CM | POA: Diagnosis not present

## 2021-09-15 LAB — BASIC METABOLIC PANEL
Anion gap: 11 (ref 5–15)
BUN: 12 mg/dL (ref 6–20)
CO2: 24 mmol/L (ref 22–32)
Calcium: 9.6 mg/dL (ref 8.9–10.3)
Chloride: 101 mmol/L (ref 98–111)
Creatinine, Ser: 0.8 mg/dL (ref 0.44–1.00)
GFR, Estimated: >60 mL/min (ref >60)
Glucose, Bld: 106 mg/dL — ABNORMAL HIGH (ref 70–99)
Potassium: 3.5 mmol/L (ref 3.5–5.1)
Sodium: 136 mmol/L (ref 135–145)

## 2021-09-15 LAB — TROPONIN I (HIGH SENSITIVITY)
Troponin I (High Sensitivity): <2 ng/L (ref <18)
Troponin I (High Sensitivity): 3 ng/L (ref <18)

## 2021-09-15 LAB — I-STAT BETA HCG BLOOD, ED (MC, WL, AP ONLY): I-stat hCG, quantitative: 11.6 m[IU]/mL — ABNORMAL HIGH (ref <5)

## 2021-09-15 LAB — CBC
HCT: 42.6 % (ref 36.0–46.0)
Hemoglobin: 14.2 g/dL (ref 12.0–15.0)
MCH: 30 pg (ref 26.0–34.0)
MCHC: 33.3 g/dL (ref 30.0–36.0)
MCV: 90.1 fL (ref 80.0–100.0)
Platelets: 362 10*3/uL (ref 150–400)
RBC: 4.73 MIL/uL (ref 3.87–5.11)
RDW: 11.3 % — ABNORMAL LOW (ref 11.5–15.5)
WBC: 8.3 10*3/uL (ref 4.0–10.5)
nRBC: 0 % (ref 0.0–0.2)

## 2021-09-15 NOTE — Discharge Instructions (Addendum)
You have been evaluated for your chest pain.  Fortunately your heart enzymes are normal.  You will likely benefit from an outpatient cardiac stress test to ensure this is not a heart related incident.  You should also follow-up with your gynecologist as previously scheduled as your beta-hCG is slightly elevated today.  Return to ER if you have any concern.

## 2021-09-15 NOTE — ED Provider Notes (Signed)
[facility] EMERGENCY DEPARTMENT Provider Note   CSN: [id number] Arrival date & time: 09/15/21  0941     History Chief Complaint  Patient presents with   Shortness of Breath   Chest Pain    Carrie Dillon is a 60 y.o. female.  The history is provided by the patient and medical records. No language interpreter was used.  Shortness of Breath Associated symptoms: chest pain   Chest Pain Associated symptoms: shortness of breath    60 year old female significant history of COPD, anxiety, hypertension, GERD, presents ED for evaluation of chest discomfort.  Patient reports she has had intermittent chest pain shortness of breath which has been ongoing for the past 6 months but worse in the past week or 2.  Described pain as a mild tightness sensation to her left chest with occasional tingling sensation to her left arm.  She also endorsed having some shortness of breath with exertion without any pleuritic chest pains or cough.  Her pain has been intermittent happen several times a day.  Furthermore, she also mentioned having temporal headache with sharp shooting sensation to both side of eyes with occasional burning sensation in her eyes without any visual changes.  She complains of recurrent pain to her hips and her shoulders.  She denies any fever chills no nausea and vomiting no pleuritic chest pain.  She has been compliant with her medication.  She is a former smoker having quit smoking for many years.  She does have a family history of ovarian cancer but she does have follow-up with an OB/GYN in the next several months.  Right now she endorsed a mild chest tightness but otherwise no significant pain.  She was seen by cardiologist many years ago and was scheduled for stress test but did not follow-up.  She felt that given her increasing age, she would like to be evaluated today.  No prior history of PE or DVT.   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   GERD (gastroesophageal reflux disease) 12/13/2012   Anxiety 12/13/2012   HTN (hypertension) 12/13/2012    Past Surgical History:  Procedure Laterality Date   ABLATION  6 Years ago   [place]; 1987   x2   RECONSTRUCTION OF NOSE  1991   TUBAL LIGATION  1987     OB History     Gravida  2   Para  2   Term  2   Preterm      AB      Living  2      SAB      IAB      Ectopic      Multiple      Live Births  2           Family History  Problem Relation Age of Onset   Anxiety disorder Mother    Colitis Mother    Ovarian cancer Mother    Hypertension Mother    Cancer Mother        ovarian   Skin cancer Father    Prostate cancer Father    Breast cancer Sister 46       x3 and multiple myeloma  Cancer Sister 47       multiple myeloma   Anxiety disorder Brother    Anxiety disorder Daughter    Stroke Maternal Aunt    Heart disease Paternal Grandmother    Heart disease Paternal Aunt    Heart attack Maternal Grandfather    Hypertension Maternal Grandfather    Hypertension Brother     Social History   Tobacco Use   Smoking status: Former    Packs/day: 2.00    Years: 36.00    Pack years: 72.00    Types: Cigarettes    Quit date: 07/20/2012    Years since quitting: 9.1   Smokeless tobacco: Never  Vaping Use   Vaping Use: Never used  Substance Use Topics   Alcohol use: No    Alcohol/week: 0.0 standard drinks    Comment: once every 6 months   Drug use: No    Types: Marijuana    Comment: Tried x 3 to relax, but caused increased stress    Home Medications Prior to Admission medications   Medication Sig Start Date End Date Taking? Authorizing Provider  diazepam (VALIUM) 5 MG tablet Take 1-2 tablets (5-10 mg total)  by mouth at bedtime as needed. Patient not taking: Reported on 04/11/2019 09/29/17   Mancel Bale, PA-C  hydrochlorothiazide (HYDRODIURIL) 25 MG tablet Take 1 tablet (25 mg total) by mouth daily. 09/29/17   Weber, Damaris Hippo, PA-C    Allergies    Codeine  Review of Systems   Review of Systems  Respiratory:  Positive for shortness of breath.   Cardiovascular:  Positive for chest pain.  All other systems reviewed and are negative.  Physical Exam Updated Vital Signs BP 126/84 (BP Location: Right Arm)   Pulse 66   Temp 98.7 F (37.1 C) (Oral)   Resp 18   Ht 5' 6"  (1.676 m)   Wt 81.6 kg   SpO2 97%   BMI 29.05 kg/m   Physical Exam Vitals and nursing note reviewed.  Constitutional:      General: She is not in acute distress.    Appearance: She is well-developed.  HENT:     Head: Atraumatic.  Eyes:     Conjunctiva/sclera: Conjunctivae normal.  Cardiovascular:     Rate and Rhythm: Normal rate and regular rhythm.  Pulmonary:     Effort: Pulmonary effort is normal.  Abdominal:     Palpations: Abdomen is soft.     Tenderness: There is no abdominal tenderness.  Musculoskeletal:     Cervical back: Neck supple.     Right lower leg: No edema.     Left lower leg: No edema.  Skin:    Capillary Refill: Capillary refill takes less than 2 seconds.     Findings: No rash.  Neurological:     Mental Status: She is alert and oriented to person, place, and time.  Psychiatric:        Mood and Affect: Mood normal.    ED Results / Procedures / Treatments   Labs (all labs ordered are listed, but only abnormal results are displayed) Labs Reviewed  BASIC METABOLIC PANEL - Abnormal; Notable for the following components:      Result Value   Glucose, Bld 106 (*)    All other components within normal limits  CBC - Abnormal; Notable for the following components:   RDW 11.3 (*)    All other components within normal limits  I-STAT BETA HCG BLOOD, ED (MC, WL, AP ONLY) - Abnormal;  Notable for  the following components:   I-stat hCG, quantitative 11.6 (*)    All other components within normal limits  TROPONIN I (HIGH SENSITIVITY)  TROPONIN I (HIGH SENSITIVITY)    EKG EKG Interpretation  Date/Time:  Wednesday September 15 2021 09:53:59 EDT Ventricular Rate:  81 PR Interval:  144 QRS Duration: 84 QT Interval:  386 QTC Calculation: 448 R Axis:   83 Text Interpretation: Normal sinus rhythm Nonspecific ST abnormality Abnormal ECG Confirmed by Carmin Muskrat [phone]) on 09/15/2021 1:23:42 PM  Radiology DG Chest 2 View  Result Date: 09/15/2021 CLINICAL DATA:  Chest pain EXAM: CHEST - 2 VIEW COMPARISON:  Chest x-ray dated January 07, 2011 FINDINGS: The heart size and mediastinal contours are within normal limits. Both lungs are clear. Mild thoracic scoliosis. IMPRESSION: No active cardiopulmonary disease. Electronically Signed   By: Yetta Glassman M.D.   On: 09/15/2021 10:49    Procedures Procedures   Medications Ordered in ED Medications - No data to display  ED Course  I have reviewed the triage vital signs and the nursing notes.  Pertinent labs & imaging results that were available during my care of the patient were reviewed by me and considered in my medical decision making (see chart for details).  Clinical Course as of 09/15/21 1359  Wed Sep 15, 2021  1257 Comment 3:        [AB]  2620 I-stat hCG, quantitative(!): 11.6 [AB]    Clinical Course User Index [AB] Demetrios Isaacs, Student-PA   MDM Rules/Calculators/A&P                           BP 119/82   Pulse 69   Temp 98.7 F (37.1 C) (Oral)   Resp 12   Ht 5' 6"  (1.676 m)   Wt 81.6 kg   SpO2 93%   BMI 29.05 kg/m   Final Clinical Impression(s) / ED Diagnoses Final diagnoses:  Atypical chest pain    Rx / DC Orders ED Discharge Orders     None      Patient here with intermittent chest discomfort.  It appears the symptoms have been ongoing for months and today she decided to be evaluated.  Her  initial HEAR score of 4, will obtain delta trop.  Patient overall well-appearing.  Score low on Wells criteria for PE have low suspicion for PE.  She does complain of some shoulder pain and hip pains as well as bilateral temple pain and discomfort.  Although polymyalgia rheumatica as a possibility, no vision changes therefore I recommend patient to follow-up with PCP for further care.  Incidentally a beta hCG was obtained and was mildly elevated at 12.  I did discuss this finding with patient and encouraged patient to have a follow-up with her gynecologist and her nurse appointment for recheck as sometimes elevated beta-hCG may be a marker for cancer.  Patient voiced understanding and agrees with plan.  4:20 PM Negative delta troponin, she ambulate without any hypoxia.  She will need to follow-up with her PCP and cardiologist outpatient for cardiac stress test for further evaluation.  Otherwise she is stable for discharge.  Patient ambulate without any hypoxia   Domenic Moras, PA-C 09/15/21 1628    Carmin Muskrat, MD 09/17/21 2352

## 2021-09-15 NOTE — ED Notes (Signed)
Called pt for vitals, no response.

## 2021-09-15 NOTE — ED Notes (Signed)
ED Provider at bedside. 

## 2021-09-15 NOTE — ED Notes (Signed)
Pt ambulated with pulse ox, sats 96-98%, no s/s of distress. Pt tolerated well. HR 72-80

## 2021-09-15 NOTE — ED Notes (Signed)
Patient stated she needed to go to her car for a minute.

## 2021-09-15 NOTE — ED Triage Notes (Signed)
Pt reports SOB for the last week, worse when she bends over. Pt reports she also has had left CP radiating to left arm, described as pressure. Pt reports headache and tingling in left arm.

## 2021-10-12 ENCOUNTER — Encounter: Payer: Self-pay | Admitting: Internal Medicine

## 2021-10-12 ENCOUNTER — Ambulatory Visit: Payer: BC Managed Care – PPO | Admitting: Internal Medicine

## 2021-10-12 ENCOUNTER — Other Ambulatory Visit: Payer: Self-pay | Admitting: Internal Medicine

## 2021-10-12 ENCOUNTER — Other Ambulatory Visit: Payer: Self-pay

## 2021-10-12 VITALS — BP 128/85 | HR 60 | Ht 66.5 in | Wt 182.0 lb

## 2021-10-12 DIAGNOSIS — R079 Chest pain, unspecified: Secondary | ICD-10-CM | POA: Diagnosis not present

## 2021-10-12 NOTE — Addendum Note (Signed)
Addended byPhineas Inches on: 10/12/2021 04:35 PM   Modules accepted: Orders

## 2021-10-12 NOTE — Patient Instructions (Addendum)
Medication Instructions:  Your Physician recommend you continue on your current medication as directed.    *If you need a refill on your cardiac medications before your next appointment, please call your pharmacy*   Lab Work: None ordered today   Testing/Procedures: Your physician has requested that you have en exercise stress myoview. For further information please visit [email or web address]. Please follow instruction sheet, as given.  [place] [address]  Follow-Up: At [facility], you and your health needs are our priority.  As part of our continuing mission to provide you with exceptional heart care, we have created designated Provider Care Teams.  These Care Teams include your primary Cardiologist (physician) and Advanced Practice Providers (APPs -  Physician Assistants and Nurse Practitioners) who all work together to provide you with the care you need, when you need it.  We recommend signing up for the patient portal called "MyChart".  Sign up information is provided on this After Visit Summary.  MyChart is used to connect with patients for Virtual Visits (Telemedicine).  Patients are able to view lab/test results, encounter notes, upcoming appointments, etc.  Non-urgent messages can be sent to your provider as well.   To learn more about what you can do with MyChart, go to [email or web address].    Your next appointment:   As needed   The format for your next appointment:   In Person  Provider:   Phineas Inches, MD   You are scheduled for a Myocardial Perfusion Imaging Study on. Please arrive 15 minutes prior to your appointment time for registration and insurance purposes.  The test will take approximately 3 to 4 hours to complete; you may bring reading material.  If someone comes with you to your appointment, they will need to remain in the main lobby due to limited space in the testing area. **If you are pregnant or breastfeeding, please notify the nuclear lab  prior to your appointment**  How to prepare for your Myocardial Perfusion Test: Do not eat or drink 3 hours prior to your test, except you may have water. Do not consume products containing caffeine (regular or decaffeinated) 12 hours prior to your test. (ex: coffee, chocolate, sodas, tea). Do bring a list of your current medications with you.  If not listed below, you may take your medications as normal. Do wear comfortable clothes (no dresses or overalls) and walking shoes, tennis shoes preferred (No heels or open toe shoes are allowed). Do NOT wear cologne, perfume, aftershave, or lotions (deodorant is allowed). If these instructions are not followed, your test will have to be rescheduled.  Please report to [address], [address] for your test.  If you have questions or concerns about your appointment, you can call the Nuclear Lab at [phone].  If you cannot keep your appointment, please provide 24 hours notification to the Nuclear Lab, to avoid a possible $50 charge to your account.

## 2021-10-12 NOTE — Addendum Note (Signed)
Addended by: Meryl Crutch on: 10/12/2021 03:56 PM   Modules accepted: Orders

## 2021-10-12 NOTE — Progress Notes (Signed)
Cardiology Office Note:    Date:  10/12/2021   ID:  Carrie Dillon, DOB 11/13/1961, MRN [id number]  PCP:  [facility] Providers Cardiologist:  None     Referring MD: Mancel Bale, PA-C   No chief complaint on file. Chest pain  History of Present Illness:    Carrie Dillon is a 60 y.o. female with a hx of htn, anxiety referred from the ED for chest pain, SOB.  She was noted to have intermittent chest pain and went to the ED 09/15/2021. She felt a pressure for 3 days. She also reports shortness of breath. If she goes up a hill or bends over she feels SOB. She walks large hills and feels winded. She had stopped frequently going up a hill Saturday. She no longer has mid chest pressure. The sharp pain under the breast area , is periodic through the day. NO exacerbating factors. No angina with walking up hill. She smoked for 30 years and quit 9 years ago. Her troponin was negative x2 in the ED. Her EKG was normal sinus rhythm no significant ischemic changes. She was started on HCTz in the past for feet swelling. No pain in the legs with ambulation. She tried lisinopril and it dropped her BP. She denies angina, dyspnea on exertion, lower extremity edema, PND or orthopnea. She denies syncope. She had a stress test before  Today she reports still having sharp chest pain intermittently.  Family History: mother- ovarian CA, no heart disease maternal GF- myocardia infarction. Two siblings- brother is healthy, sister with breast CA, multiple myeloma s/p stem cell transplant. No CVD history  Social History: quit smoking. She sits at work all day. 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     Allergies:   Codeine   Social History   Socioeconomic History   Marital status: Married    Spouse name: Gustavus "Cira Servant"    Number of children: 2   Years of education: 12   Highest education level: Not on file  Occupational History   Occupation: sales  Tobacco Use   Smoking status: Former    Packs/day: 2.00    Years: 36.00    Pack years: 72.00    Types: Cigarettes    Quit date: 07/20/2012    Years since quitting: 9.2   Smokeless tobacco: Never  Vaping Use   Vaping Use: Never used  Substance and Sexual Activity   Alcohol use: No    Alcohol/week: 0.0 standard drinks    Comment: once every 6 months   Drug use: No    Types: Marijuana    Comment: Tried x 3 to relax, but caused increased stress   Sexual activity: Yes    Partners: Male    Birth control/protection: Surgical    Comment:  1st intercourse- 15, partners- 40, married- 23 yrs  Other Topics Concern   Not on file  Social History Narrative   Lives with her husband.    Her adult children live in [place], [place].   Social Determinants of Health   Financial Resource Strain: Not on file  Food Insecurity: Not on file  Transportation Needs: Not on file  Physical Activity: Not on file  Stress: Not on file  Social Connections: Not on file     Family History: The patient's family history includes Anxiety disorder in her brother, daughter, and mother; Breast cancer (age of onset: 63) in her sister; Cancer in her mother; Cancer (age of onset: 60) in her sister; Colitis in her mother; Heart attack in her maternal grandfather; Heart disease in her paternal aunt and paternal grandmother; Hypertension in  her brother, maternal grandfather, and mother; Ovarian cancer in her mother; Prostate cancer in her father; Skin cancer in her father; Stroke in her maternal aunt.  ROS:   Please see the history of present illness.     All other systems reviewed and are negative.  EKGs/Labs/Other Studies Reviewed:    The following studies were reviewed today:   EKG:  EKG is  ordered today.  The ekg ordered today demonstrates   NSR normal ecg  Recent Labs: 09/15/2021: BUN 12; Creatinine, Ser 0.80; Hemoglobin 14.2; Platelets 362; Potassium 3.5; Sodium 136  Recent Lipid Panel    Component Value Date/Time   CHOL 209 (H) 09/29/2017 1759   TRIG 95 09/29/2017 1759   HDL 60 09/29/2017 1759   CHOLHDL 3.5 09/29/2017 1759   CHOLHDL 2.9 07/27/2016 0903   VLDL 14 07/27/2016 0903   LDLCALC 130 (H) 09/29/2017 1759     Risk Assessment/Calculations:    The ASCVD Risk score (Arnett DK, et al., 2019) failed to calculate for the following reasons:   Cannot find a previous HDL lab  ASCVD 4.0%  Physical Exam:    VS:  There were no vitals taken for this visit.    Wt Readings from Last 3 Encounters:  09/15/21 180 lb (81.6 kg)  04/11/19 169 lb (76.7 kg)  09/29/17 180 lb 3.2 oz (81.7 kg)     GEN:  Well nourished, well developed in no acute distress HEENT: Normal NECK: No JVD; No carotid bruits CARDIAC: RRR, no murmurs, rubs, gallops Vasc: 2+ BL RESPIRATORY:  Clear to auscultation without rales, wheezing or rhonchi  ABDOMEN: Soft, non-tender, non-distended MUSCULOSKELETAL:  No edema; No deformity  SKIN: Warm and dry NEUROLOGIC:  Alert and oriented x 3 PSYCHIATRIC:  Normal affect   ASSESSMENT:    Chest pain- she has persistent symptoms. Although atypical women can sometimes have atypical anginal symptoms. She is a former smoker. We discussed low risk of her CAD and whether she still felt concerned. With persistent symptoms she requested further testing. This is reasonable.Will obtain NM exercise SPECT,  if it is negative she does not need to follow-up unless anything changes. If positive will discuss the results wither her and next steps.  PLAN:    In order of problems listed above:  PRN   Shared Decision Making/Informed Consent The risks [chest pain, shortness of breath, cardiac arrhythmias, dizziness, blood pressure fluctuations, myocardial infarction, stroke/transient ischemic attack, nausea, vomiting, allergic reaction, radiation exposure, metallic taste sensation and life-threatening complications (estimated to be 1 in 10,000)], benefits (risk stratification, diagnosing coronary artery disease, treatment guidance) and alternatives of a nuclear stress test were discussed in detail with Carrie Dillon and  she agrees to proceed.    Medication Adjustments/Labs and Tests Ordered: Current medicines are reviewed at length with the patient today.  Concerns regarding medicines are outlined above.    Signed, Janina Mayo, MD  10/12/2021 10:09 AM    [facility]

## 2021-10-14 ENCOUNTER — Telehealth (HOSPITAL_COMMUNITY): Payer: Self-pay

## 2021-10-14 NOTE — Telephone Encounter (Signed)
Spoke with the patient, instructions given. She stated that she needed to reschedule due to a work conflict. New appt. Is October 26, 2021 at 7:30 am. Asked to call back with any questions. S.Sherrel Shafer EMTP

## 2021-10-19 ENCOUNTER — Ambulatory Visit (HOSPITAL_COMMUNITY): Payer: BC Managed Care – PPO

## 2021-10-26 ENCOUNTER — Ambulatory Visit (HOSPITAL_COMMUNITY): Payer: BC Managed Care – PPO

## 2021-10-27 ENCOUNTER — Other Ambulatory Visit: Payer: Self-pay

## 2021-10-27 ENCOUNTER — Encounter: Payer: Self-pay | Admitting: Obstetrics & Gynecology

## 2021-10-27 ENCOUNTER — Ambulatory Visit (INDEPENDENT_AMBULATORY_CARE_PROVIDER_SITE_OTHER): Payer: BC Managed Care – PPO | Admitting: Obstetrics & Gynecology

## 2021-10-27 ENCOUNTER — Other Ambulatory Visit (HOSPITAL_COMMUNITY)
Admission: RE | Admit: 2021-10-27 | Discharge: 2021-10-27 | Disposition: A | Discharge status: Home / Self Care | Payer: BC Managed Care – PPO | Source: Ambulatory Visit | Attending: Obstetrics & Gynecology | Admitting: Obstetrics & Gynecology

## 2021-10-27 VITALS — BP 116/74 | HR 81 | Resp 16 | Ht 65.75 in | Wt 181.0 lb

## 2021-10-27 DIAGNOSIS — Z8041 Family history of malignant neoplasm of ovary: Secondary | ICD-10-CM

## 2021-10-27 DIAGNOSIS — Z780 Asymptomatic menopausal state: Secondary | ICD-10-CM

## 2021-10-27 DIAGNOSIS — E349 Endocrine disorder, unspecified: Secondary | ICD-10-CM | POA: Diagnosis not present

## 2021-10-27 DIAGNOSIS — Z01419 Encounter for gynecological examination (general) (routine) without abnormal findings: Secondary | ICD-10-CM | POA: Insufficient documentation

## 2021-10-27 NOTE — Progress Notes (Signed)
Carrie Dillon 01-Dec-1961 [id number]   History:    60 y.o. G2P2L2 Married.  Has 2 grand-children.   RP:  Established patient presenting for annual gyn exam    HPI: Postmenopause, well on no HRT.  No PMB.  Having intermittent discomfort in the lower left abdomen.  Not complaining of bloatedness or problems with stools.  Having a normal bowel movement every day.  No urinary tract infection symptoms.  No pain with intercourse.  No abnormal vaginal discharge.  Breast normal.  Body mass index increased to 29.44.  Walking regularly.  Health labs with family physician.  [employer] 2019.   Past medical history,surgical history, family history and social history were all reviewed and documented in the EPIC chart.  Gynecologic History No LMP recorded. Patient has had an ablation.  Obstetric History OB History  Gravida Para Term Preterm AB Living  2 2 2     2   SAB IAB Ectopic Multiple Live Births          2    # Outcome Date GA Lbr Len/2nd Weight Sex Delivery Anes PTL Lv  2 Term 1987 [redacted]w[redacted]d  6 lb 5 oz (2.863 kg) F CS-Unspec   LIV  1 Term 1985 [redacted]w[redacted]d  7 lb 4 oz (3.289 kg) F CS-Unspec   LIV     ROS: A ROS was performed and pertinent positives and negatives are included in the history.  GENERAL: No fevers or chills. HEENT: No change in vision, no earache, sore throat or sinus congestion. NECK: No pain or stiffness. CARDIOVASCULAR: No chest pain or pressure. No palpitations. PULMONARY: No shortness of breath, cough or wheeze. GASTROINTESTINAL: No abdominal pain, nausea, vomiting or diarrhea, melena or bright red blood per rectum. GENITOURINARY: No urinary frequency, urgency, hesitancy or dysuria. MUSCULOSKELETAL: No joint or muscle pain, no back pain, no recent trauma. DERMATOLOGIC: No rash, no itching, no lesions. ENDOCRINE: No polyuria, polydipsia, no heat or cold intolerance. No recent change in weight. HEMATOLOGICAL: No anemia or easy bruising or bleeding. NEUROLOGIC: No headache, seizures,  numbness, tingling or weakness. PSYCHIATRIC: No depression, no loss of interest in normal activity or change in sleep pattern.     Exam:   BP 116/74   Pulse 81   Resp 16   Ht 5' 5.75" (1.67 m)   Wt 181 lb (82.1 kg)   BMI 29.44 kg/m   Body mass index is 29.44 kg/m.  General appearance : Well developed well nourished female. No acute distress HEENT: Eyes: no retinal hemorrhage or exudates,  Neck supple, trachea midline, no carotid bruits, no thyroidmegaly Lungs: Clear to auscultation, no rhonchi or wheezes, or rib retractions  Heart: Regular rate and rhythm, no murmurs or gallops Breast:Examined in sitting and supine position were symmetrical in appearance, no palpable masses or tenderness,  no skin retraction, no nipple inversion, no nipple discharge, no skin discoloration, no axillary or supraclavicular lymphadenopathy Abdomen: no palpable masses or tenderness, no rebound or guarding Extremities: no edema or skin discoloration or tenderness  Pelvic: Vulva: Normal             Vagina: No gross lesions or discharge  Cervix: No gross lesions or discharge.  Pap reflex done.  Uterus  RV, normal size, shape and consistency, non-tender and mobile  Adnexa  Without masses or tenderness  Anus: Normal   Assessment/Plan:  60 y.o. female for annual exam   1. Encounter for routine gynecological examination with Papanicolaou smear of cervix Normal gynecologic exam.  Pap  reflex done.  Breast exam normal.  Screening mammogram was - October 2022.  Cologuard in 2019.  Health labs with family physician.  Body mass index 29.44.  Recommend a lower calorie/carb diet.  Aerobic activities 5 times a week and light weightlifting every 2 days. - Cytology - PAP( [facility])  2. Postmenopause Well on no hormone replacement therapy.  No postmenopausal bleeding.  3. Family history of ovarian cancer Mother with ovarian cancer.  Sister with breast cancer.  Genetic screening negative.  Will do screening with  CA125, CEA and pelvic ultrasound at follow-up. - CA 125 - CEA - US Transvaginal Non-OB; Future  4. Elevated serum hCG Patient had a mildly elevated hCG (around 11 per patient) at presentation at emergency room about a month ago.  We will recheck beta hCG quant today.  Follow-up pelvic ultrasound. - B-HCG Quant - US Transvaginal Non-OB; Future  Other orders - Ascorbic Acid (VITAMIN C PO); Take by mouth. - VITAMIN D PO; Take by mouth. - VITAMIN A PO; Take by mouth. - POTASSIUM PO; Take by mouth. - MAGNESIUM PO; Take by mouth. - VITAMIN E PO; Take by mouth.    Princess Bruins MD, 4:11 PM 10/27/2021

## 2021-10-28 ENCOUNTER — Encounter: Payer: Self-pay | Admitting: Obstetrics & Gynecology

## 2021-10-28 LAB — HCG, QUANTITATIVE, PREGNANCY: HCG, Total, QN: 7 m[IU]/mL

## 2021-10-28 LAB — CA 125: CA 125: 12 U/mL (ref <35)

## 2021-10-28 LAB — CEA: CEA: 1.5 ng/mL

## 2021-11-01 ENCOUNTER — Other Ambulatory Visit: Payer: Self-pay

## 2021-11-01 DIAGNOSIS — Z8041 Family history of malignant neoplasm of ovary: Secondary | ICD-10-CM

## 2021-11-01 LAB — CYTOLOGY - PAP: Diagnosis: NEGATIVE

## 2021-11-02 ENCOUNTER — Encounter (HOSPITAL_COMMUNITY): Payer: BC Managed Care – PPO

## 2021-11-05 ENCOUNTER — Encounter: Payer: Self-pay | Admitting: *Deleted

## 2021-11-24 ENCOUNTER — Telehealth (HOSPITAL_COMMUNITY): Payer: Self-pay

## 2021-11-24 NOTE — Telephone Encounter (Signed)
Patient given detailed instructions per Myocardial Perfusion Study Information Sheet for the test on 11/25/21 at 0730. Patient notified to arrive 15 minutes early and that it is imperative to arrive on time for appointment to keep from having the test rescheduled.  If you need to cancel or reschedule your appointment, please call the office within 24 hours of your appointment. . Patient verbalized understanding.  TMY

## 2021-11-25 ENCOUNTER — Other Ambulatory Visit: Payer: Self-pay

## 2021-11-25 ENCOUNTER — Ambulatory Visit (HOSPITAL_COMMUNITY): Payer: BC Managed Care – PPO

## 2021-11-25 ENCOUNTER — Other Ambulatory Visit (HOSPITAL_COMMUNITY): Payer: Self-pay | Admitting: Internal Medicine

## 2021-11-25 ENCOUNTER — Ambulatory Visit (HOSPITAL_COMMUNITY): Payer: BC Managed Care – PPO | Attending: Internal Medicine

## 2021-11-25 ENCOUNTER — Encounter (HOSPITAL_COMMUNITY): Payer: Self-pay

## 2021-11-25 DIAGNOSIS — R079 Chest pain, unspecified: Secondary | ICD-10-CM | POA: Insufficient documentation

## 2021-11-25 LAB — MYOCARDIAL PERFUSION IMAGING
Angina Index: 0
Duke Treadmill Score: 4
Estimated workload: 6.4
Exercise duration (min): 4 min
Exercise duration (sec): 0 s
LV dias vol: 53 mL (ref 46–106)
LV sys vol: 9 mL
MPHR: 160 {beats}/min
Nuc Stress EF: 83 %
Peak HR: 164 {beats}/min
Percent HR: 102 %
Rest BP: 129.99 mmHg
Rest HR: 69 {beats}/min
Rest Nuclear Isotope Dose: 10.7 mCi
SDS: 8
SRS: 2
SSS: 10
ST Depression (mm): 0 mm
Stress Nuclear Isotope Dose: 31.9 mCi
TID: 0.89

## 2021-11-25 MED ORDER — TECHNETIUM TC 99M TETROFOSMIN IV KIT
10.7000 | PACK | Freq: Once | INTRAVENOUS | Status: AC | PRN
  Administered 2021-11-25: 10.7 via INTRAVENOUS
  Filled 2021-11-25: qty 11

## 2021-11-25 MED ORDER — TECHNETIUM TC 99M TETROFOSMIN IV KIT
31.9000 | PACK | Freq: Once | INTRAVENOUS | Status: AC | PRN
  Administered 2021-11-25: 31.9 via INTRAVENOUS
  Filled 2021-11-25: qty 32

## 2022-01-04 ENCOUNTER — Other Ambulatory Visit: Payer: BC Managed Care – PPO | Admitting: Obstetrics & Gynecology

## 2022-01-04 ENCOUNTER — Other Ambulatory Visit: Payer: BC Managed Care – PPO

## 2022-01-20 ENCOUNTER — Ambulatory Visit: Payer: BC Managed Care – PPO | Admitting: Obstetrics & Gynecology

## 2022-01-20 ENCOUNTER — Encounter: Payer: Self-pay | Admitting: Obstetrics & Gynecology

## 2022-01-20 ENCOUNTER — Other Ambulatory Visit: Payer: Self-pay

## 2022-01-20 ENCOUNTER — Ambulatory Visit (INDEPENDENT_AMBULATORY_CARE_PROVIDER_SITE_OTHER): Payer: BC Managed Care – PPO

## 2022-01-20 VITALS — BP 112/76 | HR 60 | Resp 12 | Ht 66.0 in | Wt 184.0 lb

## 2022-01-20 DIAGNOSIS — E349 Endocrine disorder, unspecified: Secondary | ICD-10-CM

## 2022-01-20 DIAGNOSIS — Z8041 Family history of malignant neoplasm of ovary: Secondary | ICD-10-CM

## 2022-01-20 NOTE — Progress Notes (Signed)
° ° °  MATTY DEAMER 1961/06/01 [id number]        61 y.o.  G2P2L2   RP: Mildly elevated HCG for Pelvic US  HPI: Mildly elevated Quant HCG to 11.6 in 08/2021 at the ER.  Postmenopausal with no PMB.  No pelvic pain.     OB History  Gravida Para Term Preterm AB Living  2 2 2     2   SAB IAB Ectopic Multiple Live Births          2    # Outcome Date GA Lbr Len/2nd Weight Sex Delivery Anes PTL Lv  2 Term 1987 [redacted]w[redacted]d  6 lb 5 oz (2.863 kg) F CS-Unspec   LIV  1 Term 1985 [redacted]w[redacted]d  7 lb 4 oz (3.289 kg) F CS-Unspec   LIV    Past medical history,surgical history, problem list, medications, allergies, family history and social history were all reviewed and documented in the EPIC chart.   Directed ROS with pertinent positives and negatives documented in the history of present illness/assessment and plan.  Exam:  Vitals:   01/20/22 0842  BP: 112/76  Pulse: 60  Resp: 12  Weight: 184 lb (83.5 kg)  Height: 5\' 6"  (1.676 m)   General appearance:  Normal  Pelvic US today: T/V images.  Anteverted uterus measured at 6.37 x 4.71 x 3.73 cm.  Left lateral pedunculated fibroid is present, slightly smaller than on previous scan, measured at 2.6 x 2.7 cm.  The endometrial lining is thin and symmetrical measured at 2.73 mm.  No mass or thickening seen.  Both ovaries are atrophic in appearance with no follicles seen.  No adnexal mass seen.  No free fluid in the pelvis.   Assessment/Plan:  61 y.o. G2P2L2   1. Elevated serum hCG   Mildly elevated Quant HCG to 11.6 in 08/2021 at the ER.  Postmenopausal with no PMB.  No pelvic pain.  Pelvic ultrasound findings thoroughly reviewed with patient.  Normal uterus with thin endometrium and normal atrophic ovaries.  No free fluid in the pelvis.  Patient reassured.  We will follow-up with annual gynecologic exam in November 2023.  Princess Bruins MD, 9:10 AM 01/20/2022

## 2023-01-21 IMAGING — DX DG CHEST 2V
2 series · 2 of 2 positions shown · non-contrast
Comparison: Chest x-ray dated January 07, 2011

CLINICAL DATA: Chest pain

EXAM:
CHEST - 2 VIEW

[chest pa]
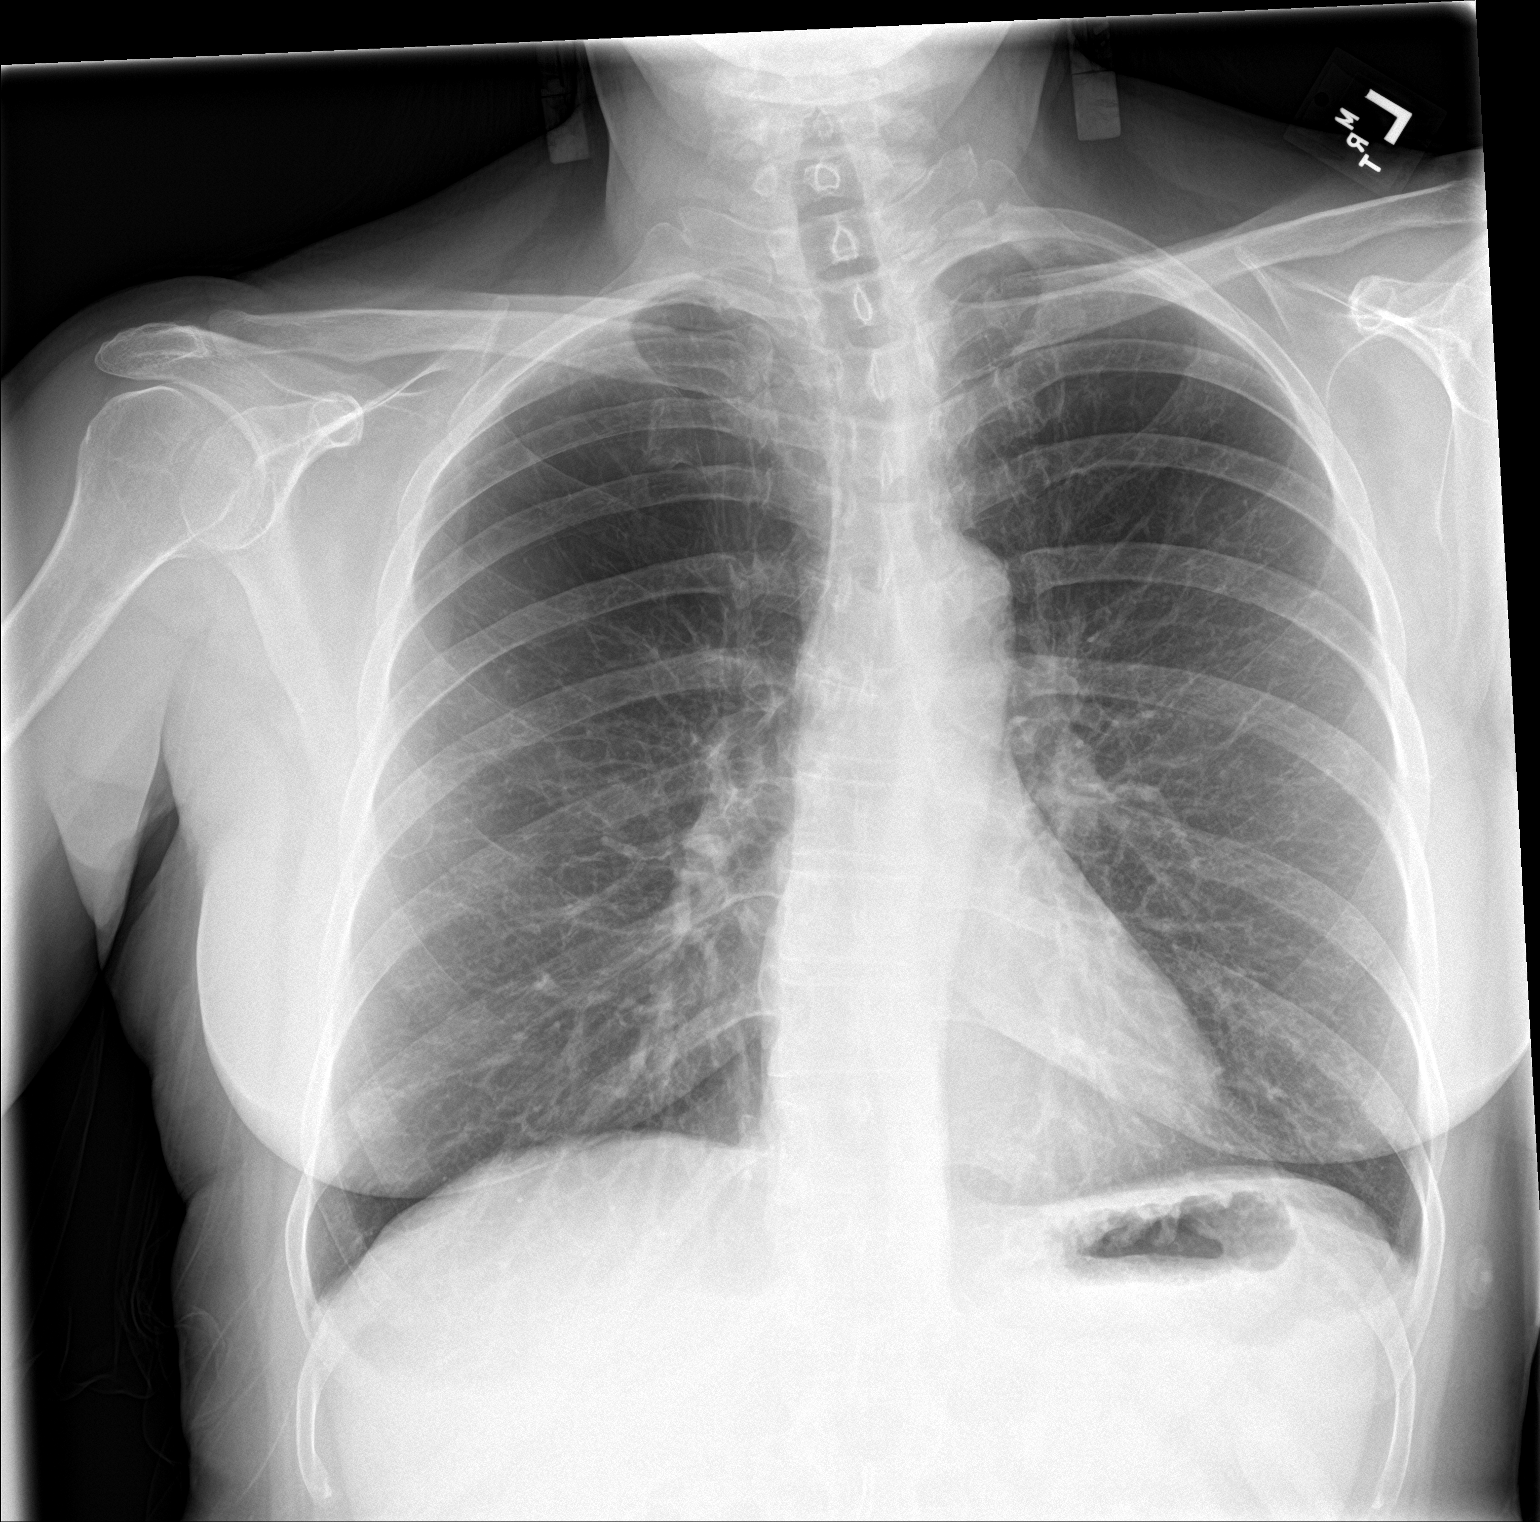

[chest lat]
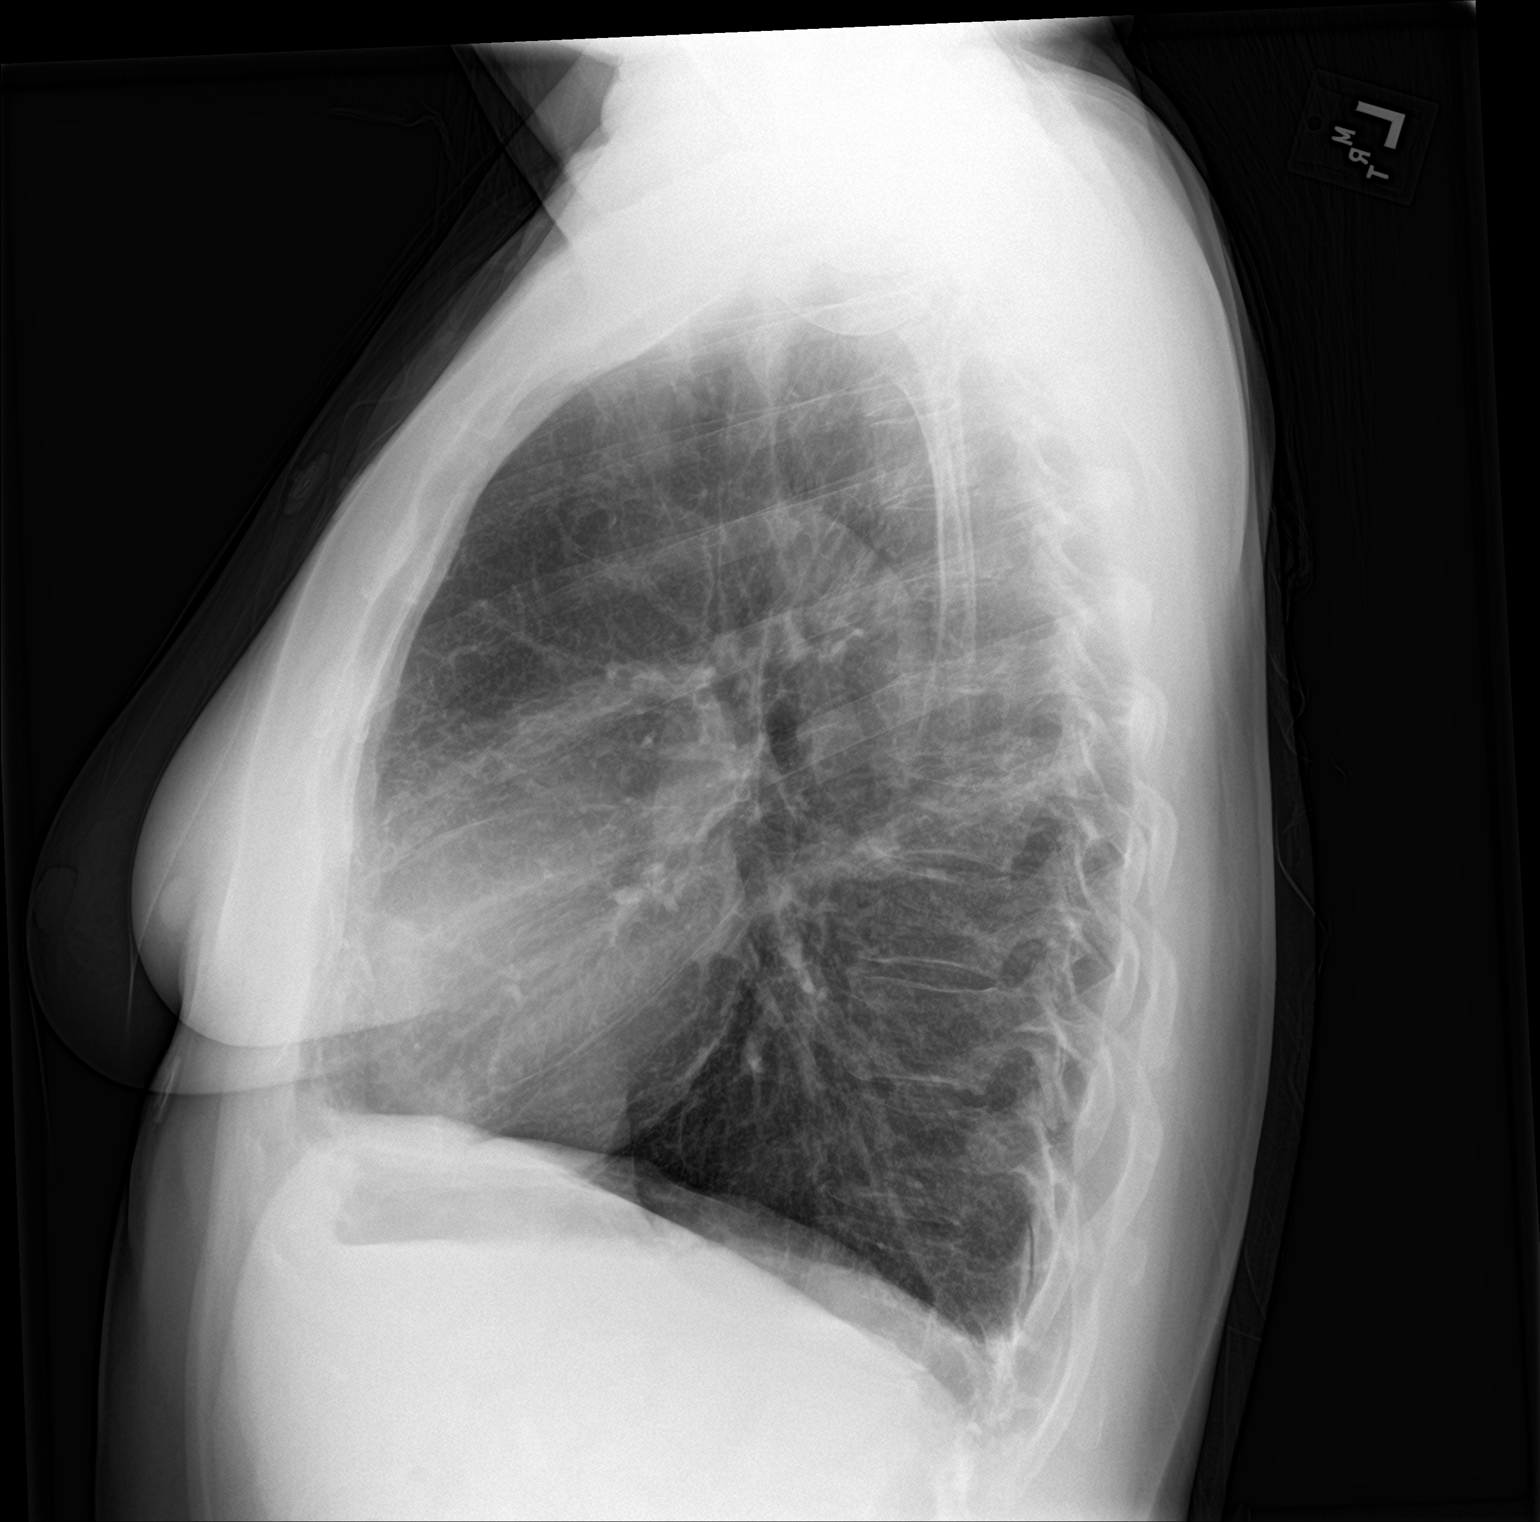

[2 of 2 positions shown; findings below may reference images not displayed]

FINDINGS: The heart size and mediastinal contours are within normal limits.
Both lungs are clear. Mild thoracic scoliosis.
IMPRESSION: No active cardiopulmonary disease.
